# Patient Record
Sex: Female | Born: 1990 | Hispanic: Yes | Marital: Single | Smoking: Never smoker
Health system: Southern US, Community
[De-identification: ages and names within clinical notes are randomized; demographics above are authoritative.]

## PROBLEM LIST (undated history)

## (undated) DIAGNOSIS — L0291 Cutaneous abscess, unspecified: Secondary | ICD-10-CM

## (undated) DIAGNOSIS — Z8489 Family history of other specified conditions: Secondary | ICD-10-CM

## (undated) DIAGNOSIS — Z789 Other specified health status: Secondary | ICD-10-CM

## (undated) HISTORY — DX: Cutaneous abscess, unspecified: L02.91

## (undated) HISTORY — DX: Family history of other specified conditions: Z84.89

## (undated) HISTORY — PX: ABSCESS DRAINAGE: SHX1119

## (undated) HISTORY — DX: Other specified health status: Z78.9

---

## 2020-06-25 ENCOUNTER — Encounter: Payer: Self-pay | Admitting: Family Medicine

## 2020-06-25 ENCOUNTER — Other Ambulatory Visit: Payer: Self-pay

## 2020-06-25 ENCOUNTER — Ambulatory Visit (INDEPENDENT_AMBULATORY_CARE_PROVIDER_SITE_OTHER): Payer: Medicaid Other | Admitting: Obstetrics & Gynecology

## 2020-06-25 VITALS — BP 105/66 | HR 73

## 2020-06-25 DIAGNOSIS — Z3201 Encounter for pregnancy test, result positive: Secondary | ICD-10-CM | POA: Diagnosis not present

## 2020-06-25 DIAGNOSIS — Z3A11 11 weeks gestation of pregnancy: Secondary | ICD-10-CM

## 2020-06-25 LAB — POCT PREGNANCY, URINE: Preg Test, Ur: POSITIVE — AB

## 2020-06-25 NOTE — Progress Notes (Signed)
Video Interpreter # [id number] Pt here today for pregnancy test.  Resulting positive.  Denies vaginal bleeding or pain.  Pt reports that LMP 04/08/20, 11w 1d today, and EDD 01/14/20.  Pt provided with list of medications that is safe to take in pregnancy.  Pt encouraged to begin PNV.  Pt advised that the front office will schedule her appointment accordingly.    Addison Naegeli, RN  06/25/20

## 2020-06-28 NOTE — Progress Notes (Signed)
Pt seen for new OB visit. All questions answered. Prenatal vitamins given.

## 2020-07-06 ENCOUNTER — Ambulatory Visit (INDEPENDENT_AMBULATORY_CARE_PROVIDER_SITE_OTHER): Payer: Self-pay | Admitting: *Deleted

## 2020-07-06 ENCOUNTER — Other Ambulatory Visit: Payer: Self-pay

## 2020-07-06 ENCOUNTER — Encounter: Payer: Self-pay | Admitting: *Deleted

## 2020-07-06 DIAGNOSIS — Z789 Other specified health status: Secondary | ICD-10-CM

## 2020-07-06 DIAGNOSIS — Z3490 Encounter for supervision of normal pregnancy, unspecified, unspecified trimester: Secondary | ICD-10-CM | POA: Insufficient documentation

## 2020-07-06 LAB — POCT URINALYSIS DIP (DEVICE)
Bilirubin Urine: NEGATIVE
Glucose, UA: NEGATIVE mg/dL
Hgb urine dipstick: NEGATIVE
Ketones, ur: NEGATIVE mg/dL
Leukocytes,Ua: NEGATIVE
Nitrite: NEGATIVE
Protein, ur: NEGATIVE mg/dL
Specific Gravity, Urine: >=1.03 (ref 1.005–1.030)
Urobilinogen, UA: 0.2 mg/dL (ref 0.0–1.0)
pH: 7 (ref 5.0–8.0)

## 2020-07-06 NOTE — Progress Notes (Signed)
New OB Intake Molly Pratt in office today for visit.   I explained I am completing New OB Intake today. We discussed her EDD of 01/13/2021 that is based on LMP of 04/08/2020. Pt is G3/P2002. I reviewed her allergies, medications, Medical/Surgical/OB history, and appropriate screenings. I informed her of [facility] services. Based on history, this is a/an uncomplicated pregnancy.  Concerns addressed today   Blood Pressure Cuff Not ordered- will have all in person visits due to Spanish speaking.  Anatomy US Explained first scheduled [place] will be around 19 weeks. Anatomy US scheduled for 07/13/2020 at 0745. Pt notified to arrive at 0730.  Labs Discussed Molly Pratt genetic screening with patient. Would like both Panorama and Horizon drawn today and  Routine prenatal labs drawn.  WIC Patient has appointment for tomorrow she had already scheduled.   COVID Vaccine Patient has already had 2 doses and provided her card. I abstracted information into her Immunization record.  First visit review I reviewed new OB appt with pt. I explained she will have a pelvic exam,PAP smear, gc. Explained pt will be seen by Molly Pratt, CNM at first visit; encounter routed to appropriate provider.  Molly Canepa,RN 07/06/2020  8:40 AM

## 2020-07-06 NOTE — Progress Notes (Signed)
Here for intitial prenatal visit. Given new ob packets.

## 2020-07-06 NOTE — Patient Instructions (Signed)
  At [facility], we work as an integrated team, providing care to address both physical and emotional health. Your medical provider may refer you to see our Behavioral Health Clinician (BHC) on the same day you see your medical provider, as availability permits.  Our BHC is available to all patients, visits generally last between 20-30 minutes, but can be longer or shorter, depending on patient need. The BHC offers help with stress management, coping with symptoms of depression and anxiety, major life changes , sleep issues, changing risky behavior, grief and loss, life stress, working on personal life goals, and  behavioral health issues, as these all affect your overall health and wellness.  The BHC is NOT available for the following: FMLA paperwork, court-ordered evaluations, specialty assessments (custody or disability), letters to employers, or obtaining certification for an emotional support animal. The BHC does not provide long-term therapy. You have the right to refuse integrated behavioral health services, or to reschedule to see the BHC at a later date.  Exception: If you are having thoughts of suicide, we require that you either see the BHC for further assessment, or contract for safety with your medical provider. Confidentiality exception: If it is suspected that a child or disabled adult is being abused or neglected, we are required by law to report that to either Child Protective Services or Adult Protective Services.  If you have a diagnosis of Bipolar affective disorder, Schizophrenia, or recurrent Major depressive disorder, we will recommend that you establish care with a psychiatrist, as these are lifelong, chronic conditions, and we want your overall emotional health and medications to be more closely monitored. If you anticipate needing extended maternity leave due to mental illness, it it recommended you inform your medical provider, so  we can put in a referral to a  psychiatrist as soon as possible. The BHC is unable to recommend an extended maternity leave for mental health issues. Your medical provider or BHC may refer you to a therapist for ongoing, traditional therapy, or to a psychiatrist, for medication management, if it would benefit your overall health. Depending on your insurance, you may have a copay to see the BHC. If you are uninsured, it is recommended that you apply for financial assistance. (Forms may be requested at the front desk for in-person visits, via MyChart, or request a form during a virtual visit).  If you see the BHC more than 6 times, you will have to complete a comprehensive clinical assessment interview with the BHC to resume integrated services.  For virtual visits with the BHC, you must be physically in the state of [place] at the time of the visit. For example, if you live in [place], you will have to do an in-person visit with the BHC. If you are going out of the state or country for any reason, the BHC may see you virtually when you return to [place], but not while you are physically outside of [place].    

## 2020-07-07 LAB — CBC/D/PLT+RPR+RH+ABO+RUB AB...
Antibody Screen: NEGATIVE
Basophils Absolute: 0 10*3/uL (ref 0.0–0.2)
Basos: 1 %
EOS (ABSOLUTE): 0.1 10*3/uL (ref 0.0–0.4)
Eos: 1 %
HCV Ab: <0.1 s/co ratio (ref 0.0–0.9)
HIV Screen 4th Generation wRfx: NONREACTIVE
Hematocrit: 41.3 % (ref 34.0–46.6)
Hemoglobin: 14 g/dL (ref 11.1–15.9)
Hepatitis B Surface Ag: NEGATIVE
Immature Grans (Abs): 0 10*3/uL (ref 0.0–0.1)
Immature Granulocytes: 0 %
Lymphocytes Absolute: 1.4 10*3/uL (ref 0.7–3.1)
Lymphs: 22 %
MCH: 29.3 pg (ref 26.6–33.0)
MCHC: 33.9 g/dL (ref 31.5–35.7)
MCV: 86 fL (ref 79–97)
Monocytes Absolute: 0.4 10*3/uL (ref 0.1–0.9)
Monocytes: 6 %
Neutrophils Absolute: 4.5 10*3/uL (ref 1.4–7.0)
Neutrophils: 70 %
Platelets: 188 10*3/uL (ref 150–450)
RBC: 4.78 x10E6/uL (ref 3.77–5.28)
RDW: 12.9 % (ref 11.7–15.4)
RPR Ser Ql: NONREACTIVE
Rh Factor: POSITIVE
Rubella Antibodies, IGG: 7.54 index (ref >0.99)
WBC: 6.4 10*3/uL (ref 3.4–10.8)

## 2020-07-07 LAB — HEMOGLOBIN A1C
Est. average glucose Bld gHb Est-mCnc: 100 mg/dL
Hgb A1c MFr Bld: 5.1 % (ref 4.8–5.6)

## 2020-07-07 LAB — HCV INTERPRETATION

## 2020-07-08 LAB — CULTURE, OB URINE

## 2020-07-08 LAB — URINE CULTURE, OB REFLEX

## 2020-07-13 ENCOUNTER — Other Ambulatory Visit (HOSPITAL_COMMUNITY)
Admission: RE | Admit: 2020-07-13 | Discharge: 2020-07-13 | Disposition: A | Discharge status: Home / Self Care | Payer: Medicaid Other | Source: Ambulatory Visit | Attending: Certified Nurse Midwife | Admitting: Certified Nurse Midwife

## 2020-07-13 ENCOUNTER — Encounter: Payer: Self-pay | Admitting: Certified Nurse Midwife

## 2020-07-13 ENCOUNTER — Ambulatory Visit (INDEPENDENT_AMBULATORY_CARE_PROVIDER_SITE_OTHER): Payer: Medicaid Other | Admitting: Certified Nurse Midwife

## 2020-07-13 ENCOUNTER — Other Ambulatory Visit: Payer: Self-pay

## 2020-07-13 VITALS — BP 105/75 | HR 89 | Wt 125.4 lb

## 2020-07-13 DIAGNOSIS — Z3492 Encounter for supervision of normal pregnancy, unspecified, second trimester: Secondary | ICD-10-CM

## 2020-07-13 DIAGNOSIS — O26892 Other specified pregnancy related conditions, second trimester: Secondary | ICD-10-CM | POA: Insufficient documentation

## 2020-07-13 DIAGNOSIS — Z789 Other specified health status: Secondary | ICD-10-CM

## 2020-07-13 DIAGNOSIS — Z3A13 13 weeks gestation of pregnancy: Secondary | ICD-10-CM

## 2020-07-13 DIAGNOSIS — N898 Other specified noninflammatory disorders of vagina: Secondary | ICD-10-CM

## 2020-07-13 NOTE — Progress Notes (Signed)
History:   Molly Pratt is a 29 y.o. G3P2002 at [redacted]w[redacted]d by LMP being seen today for her first obstetrical visit.  Her obstetrical history is significant for 2 SVD. Patient does intend to breast feed. Pregnancy history fully reviewed.  Patient reports urinary frequency .     HISTORY: OB History  Gravida Para Term Preterm AB Living  3 2 2  0 0 2  SAB TAB Ectopic Multiple Live Births  0 0 0 0 2    # Outcome Date GA Lbr Len/2nd Weight Sex Delivery Anes PTL Lv  3 Current           2 Term 11/13/08 [redacted]w[redacted]d  7 lb 8 oz (3.402 kg)  Vag-Spont None  LIV     Birth Comments: wnl  1 Term 09/05/05 [redacted]w[redacted]d  7 lb (3.175 kg)  Vag-Spont None  LIV     Birth Comments: wnl    Last pap smear was done 2019 in 2020 and was normal per patient- we do not have records.   Past Medical History:  Diagnosis Date  . Abscess    in back as child,had surgery   Past Surgical History:  Procedure Laterality Date  . ABSCESS DRAINAGE     back as a child   Family History  Problem Relation Age of Onset  . Hypertension Mother    Social History   Tobacco Use  . Smoking status: Never Smoker  . Smokeless tobacco: Never Used  Vaping Use  . Vaping Use: Never used  Substance Use Topics  . Alcohol use: Not Currently    Comment: occasionally   . Drug use: Never   No Known Allergies Current Outpatient Medications on File Prior to Visit  Medication Sig Dispense Refill  . Prenatal Vit-Fe Fumarate-FA (PRENATAL VITAMINS PO) Take 1 tablet by mouth daily.     No current facility-administered medications on file prior to visit.    Review of Systems Pertinent items noted in HPI and remainder of comprehensive ROS otherwise negative. Physical Exam:   Vitals:   07/13/20 0910  BP: 105/75  Pulse: 89  Weight: 125 lb 6.4 oz (56.9 kg)   Fetal Heart Rate (bpm): 156  Pelvic Exam: Perineum: no hemorrhoids, normal perineum   Vulva: normal external genitalia, no lesions  System: General: well-developed,  well-nourished female in no acute distress   Breasts:  normal appearance, no masses or tenderness bilaterally   Skin: normal coloration and turgor, no rashes   Neurologic: oriented, normal, negative, normal mood   Extremities: normal strength, tone, and muscle mass, ROM of all joints is normal   HEENT PERRLA, extraocular movement intact and sclera clear   Mouth/Teeth mucous membranes moist, pharynx normal without lesions and dental hygiene good   Neck supple and no masses   Cardiovascular: regular rate and rhythm   Respiratory:  no respiratory distress, normal breath sounds   Abdomen: soft, non-tender; bowel sounds normal; no masses,  no organomegaly    Assessment:    Pregnancy: 07/15/20 Patient Active Problem List   Diagnosis Date Noted  . Supervision of low-risk pregnancy 07/06/2020  . Language barrier 07/06/2020     Plan:    1. Encounter for supervision of low-risk pregnancy in second trimester - patient doing well, reports urinary frequency since becoming pregnant, discussed results of urine culture with patient which was negative, urinary frequency present due to pregnancy  - routine prenatal care - anticipatory guidance on upcoming appointments - patient request pap smear to be done PP  2. Language barrier - spanish interpreter at bedside   3. Vaginal discharge during pregnancy in second trimester - patient reports vaginal discharge for the past 2 months  - describes discharge as white secretions with odor  - blind swabs collected today and will manage accordingly  - Cervicovaginal ancillary only( [place])  4. [redacted] weeks gestation of pregnancy   Initial labs reviewed with patient  Continue prenatal vitamins. Problem list reviewed and updated. Genetic Screening discussed, NIPS: results pending. Ultrasound discussed; fetal anatomic survey: ordered. Anticipatory guidance about prenatal visits given including labs, ultrasounds, and testing. Discussed usage of  Babyscripts and virtual visits as additional source of managing and completing prenatal visits in midst of coronavirus and pandemic.   Encouraged to complete MyChart Registration for her ability to review results, send requests, and have questions addressed.  The nature of [facility] with multiple MDs and Advanced Practice Providers was explained to patient; also emphasized that residents, students are part of our team. Routine obstetric precautions reviewed. Encouraged to seek out care at office or emergency room [facility] preferred) for urgent and/or emergent concerns. Return in about 4 weeks (around 08/10/2020) for LROB, in person, AFP.     Sharyon Cable, CNM [facility] [employer]

## 2020-07-13 NOTE — Patient Instructions (Signed)
AREA PEDIATRIC/FAMILY PRACTICE PHYSICIANS  [place] ([address]) . [facility] o Chambliss, MD; Eniola, MD; Hale, MD; Hensel, MD; McDiarmid, MD; McIntyer, MD; Leshawn Straka, MD; Walden, MD o [address] [place], [place] [address] o [phone] o Mon-Fri 8:30-12:30, 1:30-5:00 o Providers come to see babies at [facility] o Accepting Medicaid . [facility] o Limited providers who accept newborns: Koirala, MD; Morrow, MD; Wolters, MD o [address], [place], [place] [address] o [phone] o Mon-Fri 8:00-5:30 o Babies seen by providers at [facility] o Does NOT accept Medicaid o Please call early in hospitalization for appointment (limited availability)  . [facility] o Mulberry, MD o [address] [place], [place] [address] o [phone] o Mon, Tue, Thur, Fri 8:30-5:00, Wed 10:00-7:00 (closed 1-2pm) o Babies seen by [facility] providers o Accepting Medicaid . [facility] o Rubin, MD o [address], [place], [place] [address] o [phone] o Mon-Fri 8:30-5:00, Sat 8:30-12:00 o Provider comes to see babies at [facility] o Accepting Medicaid o Must have been referred from current patients or contacted office prior to delivery . [facility] ([facility]) o Brown, MD; Chandler, MD; Ettefagh, MD; Grant, MD; Lester, MD; McCormick, MD; McQueen, MD; Prose, MD; Simha, MD; Stanley, MD; Stryffeler, NP; Tebben, NP o [address], [place], [place] [address] o [phone] o Mon, Tue, Thur, Fri 8:30-5:30, Wed 9:30-5:30, Sat 8:30-12:30 o Babies seen by [facility] providers o Accepting Medicaid o Only accepting infants of first-time parents or siblings of current patients o Hospital discharge coordinator will make follow-up appointment . Jack Amos o [address],  [place], [place]  [address] o [phone]   Fax - [phone] . [facility] o [address], [place], [place]  [address] o Phone - [phone]   Fax - [phone] . Shilpa Gosrani o [address], [place], [place]  [address] o [phone]  [place] ([address]) . [facility] o Bates, MD; [place], MD; Cooper, Cox, MD; MD; Davis, MD; Dovico, MD; Ettefaugh, MD; Little, MD; Lowe, MD; Keiffer, MD; Melvin, MD; Sumner, MD; Williams, MD o [address], [place], [place] [address] o [phone] o Mon-Fri 8:30-5:00 (extended evenings Mon-Thur as needed), Sat-Sun 10:00-1:00 o Providers come to see babies at [facility] o Accepting Medicaid for families of first-time babies and families with all children in the household age 3 and under. Must register with office prior to making appointment (M-F only). . [facility] o Henson, NP; Knapp, MD; Lalonde, MD; Tysinger, PA o [address] [place], [place] [address] o [phone] o Mon-Fri 8:00-5:00 o Babies seen by providers at [facility] o Does NOT accept Medicaid/Commercial Insurance Only . [facility] [place] ([facility])  o Artis, MD; Barnes, MD; Bratton, MD; Coccaro, MD; Lockett Gardner, MD; Kramer, MD; Marshall, MD; Netherton, MD; Poleto, MD; Skinner, MD o [address] [place], [place] [address] o [phone] o Mon-Fri 8:30-5:30, Sat (Oct.-Mar.) 9:00-1:00 o Babies seen by providers at [facility] o Accepting Medicaid  [place] ([address]) . [facility] o Reid, MD; Warner, MD o [address], [place], [place] [address] o [phone] o Mon-Fri 8:30-5:00, Sat 8:30-12:00 o Providers come to see babies at [facility] o Does NOT accept Medicaid . [facility] o Becker, PA; Hagler, MD; Scifres, PA; Sun, MD; Swayne, MD o [address],  [place], [place] [address] o [phone] o Mon-Fri 8:00-5:00 o Babies seen by providers at [facility] o Does NOT accept Medicaid o Only accepting babies of parents who are patients o Please call early in hospitalization for appointment (limited availability) . [facility] o Clark, MD; Frye, MD; Kelleher, MD; Mack, NP; Miller, MD; O'Keller, MD; Patterson, NP; Pudlo, MD; Puzio, MD; Thomas, MD; Tucker, MD; Twiselton, MD o [address], [place], [place] [address] o [phone] o Mon-Fri 8:00-5:00, Sat 9:00-12:00 o Providers come to see babies at [facility] o Does NOT accept Medicaid  [place] ([address]) . [facility] o Limited providers accepting new patients: Brake, NP; Wharton, PA o [address], [place], [place] [address] o [phone] o Mon-Fri 8:00-5:00 o Babies seen by providers at [facility] o Does NOT accept Medicaid o Only accepting babies of parents who are patients o Please call early in hospitalization for appointment (limited availability) . [facility] o Gay, MD; Quinlan, MD o [address] [place], [place] [address] o [phone] (press 1 to schedule appointment) o Mon-Fri 8:00-5:00 o Providers come to see babies at [facility] o Does NOT accept Medicaid . [facility] o Mazer, MD o [address] [place], [place] [address] o [phone] o Mon-Fri 8:30-5:00 (lunch 12:30-1:00), extended hours by appointment only Wed 5:00-6:30 o Babies seen by [facility] providers o Accepting Medicaid . [facility] o Banks, MD; Jordan, MD; Koberlein, MD o [address], [place], [place] [address] o [phone] o Mon-Fri 8:00-5:00 o Babies seen by [facility] providers o Does NOT accept Medicaid . [facility] o Parker, MD; Hunter, MD; Wallace, DO o [address] [place], [place]  [address] o [phone] o Mon-Fri 8:00-5:00 o Babies seen by [facility] providers o Does NOT accept Medicaid . [facility] o Brandon, PA; Brecken, PA; Christy, NP; Dees, MD; DeClaire, MD; DeWeese, MD; Hansen, NP; Mills, NP; Parrish, NP; Smoot, NP; Summer, MD; Vapne, MD o [address] [place], [place] [address] o [phone] o Mon-Fri 8:30-5:00, Sat 10:00-1:00 o Providers come to see babies at [facility] o Does NOT accept Medicaid o Free prenatal information session Tuesdays at 4:45pm . [facility] o Bouska, MD; Gordon, PA; Jeffery, PA; Weber, PA o [address] [place] [address] o [phone] o Mon-Fri 7:30-5:30 o Babies seen by [facility] providers . [facility] o [address], [place], [place]  [address] o [phone]   Fax - [phone]  [place] ([address]) . [facility] o Reese, MD o [address] [place], [place] [address] o [phone] o Mon-Thur 8:00-6:00 o Providers come to see babies at [facility] o Accepting Medicaid . [facility] o Anderson, NP; Badger, MD; Beal, PA; Spencer, PA o [address] [place], [place] [address] o [phone] o Mon-Thur 7:30-7:30, Fri 7:30-4:30 o Babies seen by [facility] providers o Accepting Medicaid . [facility] o Agbuya, MD; Klett, NP; Romgoolam, MD o [address], [place],  [address] o [phone] o Mon-Fri 8:30-5:00, Sat 8:30-12:00 o Providers come to see babies at [facility] o Accepting Medicaid o Must have "Meet & Greet" appointment at office prior to delivery . [facility] ([facility] o McCord,   MD; Wallace, MD; Wood, MD o [address], [place], [place] [address] o [phone] o Mon-Wed 8:00-6:00, Thur-Fri 8:00-5:00, Sat 9:00-12:00 o Providers come to  see babies at [facility] o Does NOT accept Medicaid o Only accepting siblings of current patients . [facility]  o [address], [place], [place]  [address] o [phone]   Fax - [phone] . [facility] o [address], [place], [place]  [address] o [phone]   Fax - [phone]  [place] ([address] & [address]) . [facility] o Cirigliano, DO; Matthews, DO o [address] [place], [place] [address] o [phone] o Mon-Fri 7:00-5:00 o Babies seen by [facility] providers o Does NOT accept Medicaid . [facility] o Briscoe, MD; Howley, PA; Moreira, PA o [address], [place], [place] [address] o [phone] o Mon-Fri 8:00-5:00 o Babies seen by [facility] providers o Accepting Medicaid . [facility] o Boyd, MD; Church, PA; Jones, NP; Osborn, PA o [address], [place], [place] [address] o [phone] o Mon-Fri 8:00-5:00 o Babies seen by providers at [facility] o Accepting Medicaid  [place] ([address]) . [facility] [place] o Wendling, DO o [address] [place], [place] [address] o [phone] o Mon-Fri 8:00-5:00 o Babies seen by [facility] providers o Does NOT accept Medicaid o Limited availability, please call early in hospitalization to schedule follow-up . [facility] o Calderon, PA; Cummings, MD; Dillard, MD; Martin, PA; Olson, MD; VanDeven, PA o [address], [place], [place] [address] o [phone] o Mon-Fri 8:30-5:00, Sat 9:00-12:00 o Babies seen by providers at [facility] o Accepting Medicaid o Please register online then schedule online or call office o [email or web address] . [facility] o Hunter, NP; Kumar, MD; Martin Rogers, PA o [address], [place], [place] [address] o [phone] o Mon-Fri 8:00-5:00 o Babies seen by providers at [facility] o Accepting Medicaid . [facility] ([facility] o Coldstream, MD; Kristi Fleenor, NP; West, MD o [address], [place], [place] [address] o [phone] o Mon-Fri 8:00-5:30, Sat&Sun by appointment (phones open at 8:30) o Babies seen by [facility] providers o Accepting Medicaid o Must be a first-time baby or sibling of current patient . [facility] [place]  o [address], [place], [place]  [address] o [phone]   Fax - [phone]  [place] ([address]) . [facility] o Brown, PA; Cowen, PA; Rice, MD; Helton, PA; Spry, MD o [address] [place], [place] [address] o [phone] o Mon-Thur 8:00-7:00, Fri 8:00-5:00, Sat 8:00-12:00, Sun 9:00-12:00 o Babies seen by [facility] providers o Accepting Medicaid . [facility] [place] o Coe-Goins, MD; Marshall, MD; Pierre-Louis, MD o [address], [place], [place] [address] o [phone] o Mon-Thur 8:00-5:00 o Babies seen by providers at [facility] o Accepting Medicaid . [facility] o Bratton, MD; Coe-Goins, MD; Hayes, MD; Lewis, MD; List, MD; Lott, MD; Marshall, MD; Moran, MD; O'Daxson Reffett, MD; Pierre-Louis, MD; Pitonzo, MD; Scholer, MD; Spangle, MD o [address] [place],    [address] o [phone] o Mon-Fri 8:00-5:30, Sat (Oct.-Mar.) 9:00-1:00 o Babies seen by providers at [facility] o Accepting Medicaid o Must fill out new patient packet, available online at [email or web address] . [facility] ([facility] o Friddle, NP; Harris, NP; Kelly, NP; Logan, MD;  Melvin, PA; Poth, MD; Ramadoss, MD; Stanton, NP o [address], [place], [place] [address] o [phone] o Mon-Thur 8:00-5:30, Fri 8:00-5:00 o Babies seen by providers at [facility] o Accepting Medicaid  [place] ([address]) . [facility] o Dixon, PA; Lemay, MD; Pickard, MD; Tapia, PA o [address], [place], [place] [address] o [phone] o Mon-Fri 8:00-5:00 o Babies seen by providers at [facility] o Accepting Medicaid   [place] ([address]) . [facility] [place] o Masneri, DO; Meyers, MD; Nelson, PA o [address], [place], [place] [address] o [phone] o Mon-Fri 8:00-5:00 o Babies seen by providers at [facility] o Does NOT accept Medicaid o Limited appointment availability, please call early in hospitalization  . [facility] [place] o Kunedd, DO; McGowen, MD o [address], [place], [place] [address] o [phone] o Mon-Fri 8:00-5:00 o Babies seen by [facility] providers o Does NOT accept Medicaid . [facility] [place] o Cameron, MD; MacDonald, MD; Michaels, PA; Nayak, MD o [address] [place] [address], [place], [place] [address] o [phone] o Mon-Fri 8:00-5:00 o After hours clinic ([address] [place], [place] [address]) [phone] Mon-Fri 5:00-8:00, Sat 12:00-6:00, Sun 10:00-4:00 o Babies seen by [facility] providers o Accepting Medicaid . [facility] [place] o [address], [place], [place]  [address] o [phone]   Fax - [phone]  [place] ([address]) . [facility] [place] [facility] o Andy, MD o [address], [place], [place] [address] o [phone] o Mon-Fri 8:00-5:00 o Babies seen by [facility] providers o Does NOT accept Medicaid . [facility] [place] ([facility] [place]) o Eksir, MD o [address], [place], [place]  [address] o [phone] o Mon-Thur 8:00-7:00, Fri 8:00-5:00, Sat 8:00-12:00 o Babies seen by providers at [facility] o Accepting Medicaid - but does not have vaccinations in office (must be received elsewhere) o Limited availability, please call early in hospitalization  [place] ([address]) . [facility]  o Charlene Flemming, MD o [address] [place] [address], [place] [address] o [phone]  Fax [phone]   

## 2020-07-14 ENCOUNTER — Telehealth: Payer: Self-pay | Admitting: Lactation Services

## 2020-07-14 NOTE — Telephone Encounter (Signed)
Called patient with assistance of [address] Spanish Telephone Interpreter, [place] # [phone].   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   Reviewed it is recommended that she call [facility] at [phone] to schedule a Telephone Genetic Counseling Session to discuss results. Reviewed it is recommended that fob also be tested to see if he has the same gene.   Patient questions answered. Patient voiced understanding.

## 2020-07-15 ENCOUNTER — Encounter: Payer: Self-pay | Admitting: *Deleted

## 2020-07-15 ENCOUNTER — Encounter: Payer: Self-pay | Admitting: Certified Nurse Midwife

## 2020-07-15 DIAGNOSIS — Z148 Genetic carrier of other disease: Secondary | ICD-10-CM | POA: Insufficient documentation

## 2020-07-16 LAB — CERVICOVAGINAL ANCILLARY ONLY
Bacterial Vaginitis (gardnerella): POSITIVE — AB
Candida Glabrata: NEGATIVE
Candida Vaginitis: NEGATIVE
Chlamydia: NEGATIVE
Comment: NEGATIVE
Comment: NEGATIVE
Comment: NEGATIVE
Comment: NEGATIVE
Comment: NEGATIVE
Comment: NORMAL
Neisseria Gonorrhea: NEGATIVE
Trichomonas: NEGATIVE

## 2020-07-17 ENCOUNTER — Telehealth: Payer: Self-pay

## 2020-07-17 MED ORDER — METRONIDAZOLE 500 MG PO TABS: 500.0000 mg | ORAL_TABLET | Freq: Two times a day (BID) | ORAL | 0 refills | Status: DC

## 2020-07-17 NOTE — Telephone Encounter (Signed)
Called Pt using [address] [place] id# [phone], to advise Pt os test results showing +BV, No answer, left VM for call back.

## 2020-07-17 NOTE — Telephone Encounter (Signed)
-----   Message from Sharyon Cable, CNM sent at 07/17/2020  9:22 AM EST ----- Please call patient and notify of bacterial vaginosis. Rx sent to pharmacy on file for treatment.    Steward Drone CNM

## 2020-07-17 NOTE — Addendum Note (Signed)
Addended by: Sharyon Cable on: 07/17/2020 09:22 AM   Modules accepted: Orders

## 2020-07-27 ENCOUNTER — Telehealth: Payer: Self-pay

## 2020-07-27 NOTE — Telephone Encounter (Signed)
Called Pt using [address] Molly Pratt id# [id number] to advise that she tested positive for BV & Rx was sent to her pharmacy. Pt stated pharmacy did call her but she couldn't understand them. So explained to her the name of the medicine & how to take it. Pt verbalized understanding.

## 2020-08-12 ENCOUNTER — Encounter: Payer: Self-pay | Admitting: Student

## 2020-08-12 ENCOUNTER — Encounter: Payer: Self-pay | Admitting: *Deleted

## 2020-08-12 ENCOUNTER — Other Ambulatory Visit: Payer: Self-pay

## 2020-08-12 ENCOUNTER — Ambulatory Visit (INDEPENDENT_AMBULATORY_CARE_PROVIDER_SITE_OTHER): Payer: Medicaid Other | Admitting: Student

## 2020-08-12 VITALS — BP 109/78 | HR 86 | Wt 126.5 lb

## 2020-08-12 DIAGNOSIS — Z3492 Encounter for supervision of normal pregnancy, unspecified, second trimester: Secondary | ICD-10-CM

## 2020-08-12 DIAGNOSIS — Z3A18 18 weeks gestation of pregnancy: Secondary | ICD-10-CM | POA: Diagnosis not present

## 2020-08-12 NOTE — Patient Instructions (Signed)
El sndrome frgil de X (FXS) es una condicin gentica que muestra atributos fsicos tpicos junto con anomalas del comportamiento y de desarrollo en nios. Tambin se llama sndrome de Alejandro Mulling o un sndrome del marcador X.  FXS presenta con discurso y el desarrollo del lenguaje demorados junto con un cierto nivel de incapacidad intelectual. stos pueden tambin incluir ADHD, ansiedad y autstico-como incapacidad social. Aunque los atributos fsicos sean algo sutiles en nios jovenes, llegan a ser ms prominentes con edad. stos incluyen los aros hundidos (hipoplasia del midface) con Neomia Dear cara larga, dedos pulgares juntados dobles, un paladar arqueado, los pies planos y los odos grandes. Las manifestaciones fsicas de FXS no estn siempre presentes en su totalidad.  Sndrome frgil de X, un desorden gentico del cromosoma X (gen FMR1) y sntomas principales y Administrator, Civil Service. Leanor Rubenstein de imagen: ellepigrafica/Shutterstock Sndrome frgil de X, un desorden gentico del cromosoma X (gen FMR1) y sntomas principales y Administrator, Civil Service. Leanor Rubenstein de imagen: ellepigrafica/[place]utter[employer] Gentica y patofisiologa de FXS FXS es la causa ms comn de la discapacidad de Psychologist, prison and probation services. FXS es causas por las mutaciones (averas) a un gen en el cromosoma X (FMRI1). En esta mutacin, los pacientes pueden tener extensiones hasta 200 o relanzada de CGG, mientras que la poblacin normal tendra no ms que 40 repeticiones. Esta extensin de la repeticin impone silencio al gen que lo hace functionless. Por consiguiente, se empeora el revelado normal del cerebro se afecta es decir la capacidad para las conexiones de los nervios con plasticidad sinptica.  FXS es ms comn en muchachos que est en muchachas, puesto que los muchachos llevan un nico cromosoma X, l es ms seriamente afectado que las muchachas que pueden tener un gen normal FMR1 en el segundo cromosoma X sano. Las [employer] afectadas  pueden tener solamente incapacidades suaves comparadas a los [place]. Las [employer] sin embargo, son ondas portadoras de la condicin y pueden pasarla conectado a su descendiente masculino.  Algunas personas afectadas pueden tambin tener un pequeo cambio o una mutacin y as pueden ser ondas portadoras. Pasan conectado la mutacin completa a la generacin siguiente.    Representacin diagramtica de la configuracin de la transmisin de X frgil  Cmo el campo comn es FXS? Influencias frgiles de X cerca de 1 en 4000 hombres y alrededor 1 en 6000-8000 mujeres. Afecta a todas las carreras y pertenencias tnicas. Puede ser llevado por sos con la afliccin suave del gen FMR1 y puede ser pasado conectado al descendiente.  En el [place], la incidencia total de FXS en la poblacin en general es 2,3 por 10.000 o 1 en 4.425. Dos a cuatro veces tantas hembras como varones son ondas portadoras del premuation gentico (50 o encima de repeticiones de CGG). Solamente un tercero de hembras lleva el gen anormal responsable de discapacidades de aprendizaje.  Sntomas de FXS El sntoma ms comn del FXS es debilitacin intelectual. Hay una amplia gama de diferencia en el ndice de inteligencia entre sos afectados. Algunos pacientes pueden tener un ndice de inteligencia normal y no [place] ningn signo de X frgil, mientras que algunos pueden tener dificultades de aprendizaje severas. El [place] de debilitacin es relacionado en el fragmento de las repeticiones de CGG al FMR1.  Adems de dficits intelectuales hay tambin problemas emocionales y del comportamiento. stos pueden incluir caractersticas de:  Desorden de dficit de atencin Hiperactividad Impulsivity Ansiedad Voltajes de entrada alternativos de humor Hay caractersticas faciales tpicas y atributos fsicos de FXS. stos incluyen [employer], odos  grandes, pies planos, una alta frente, testculos grandes, bocas grandes, y  juntas extremadamente flexibles.  Diagnosis de FXS X frgil se puede diagnosticar usando anlisis de la DNA para confirmar mutaciones a FMR1. La DNA se puede tambin llegar un feto nonato tambin. Esto es hecha recogiendo [place] del lquido amnitico o de los tejidos dentro de la matriz, Warden/ranger muestreo de vellosidad corinica o amniocentesis, y se Dentist aproximadamente 11 semanas del Psychiatrist.  Administracin y tratamiento de FXS A partir de ahora no hay vulcanizacin o tratamiento para FXS. Los nios con FXS necesitan el apoyo del comportamiento de la terapia, del asesoramiento, emocional y [place]. Los nios con discurso y Media planner del lenguaje demorados pueden necesitar la ayuda del discurso y del terapeuta del lenguaje.  La terapia del comportamiento es til para los nios con dficit de atencin, impulsivity y otros problemas del comportamiento. Algunos nios pueden poder hacer frente bien en escuelas de la corriente principal. Sin embargo, sos con inteligencia y dificultades de aprendizaje importante empeoradas pueden beneficiarse de escuelas especiales para cubrir sus necesidades educativas especiales.  La medicacin tambin se [place] para tratar o para manejar algunos de los sntomas secundarios que presentan variable a travs de [place]. Estas medicaciones incluyen los estimulantes que apuntan los dficits y la hiperactividad attentional (medicaciones de ADHD). SSRIs (antidepresivos) se puede tambin Chemical engineer a veces para tratar voltajes de entrada alternativos de la ansiedad y de humor. Para sos el sufrimiento de los anticonvulsivos de las capturas (el 15% de Civil engineer, contracting) puede tambin ser [place].  La investigacin animal reciente ha [employer] drogas que son los Building control surveyor (receptores del glutamato de la cuadra en el cerebro) pueden Paramedic el aumento sinptico de los problemas por lo tanto cognoscitivo, intelectual y algunos sntomas del  comportamiento. Estas drogas experimentales Continental Airlines prometedores, pero necesidad de ser investigado completo en juicios clnicas ms grandes antes de fijar su eficacia.  Iris Pert

## 2020-08-12 NOTE — Progress Notes (Addendum)
   PRENATAL VISIT NOTE  Subjective:  Molly Pratt is a 29 y.o. G3P2002 at [redacted]w[redacted]d being seen today for ongoing prenatal care.  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  Patient reports no complaints.  Contractions: Not present. Vag. Bleeding: None.  Movement: Absent. Denies leaking of fluid.   The following portions of the patient's history were reviewed and updated as appropriate: allergies, current medications, past family history, past medical history, past social history, past surgical history and problem list.   Objective:   Vitals:   08/12/20 0832  BP: 109/78  Pulse: 86  Weight: 126 lb 8 oz (57.4 kg)    Fetal Status: Fetal Heart Rate (bpm): 151   Movement: Absent     General:  Alert, oriented and cooperative. Patient is in no acute distress.  Skin: Skin is warm and dry. No rash noted.   Cardiovascular: Normal heart rate noted  Respiratory: Normal respiratory effort, no problems with respiration noted  Abdomen: Soft, gravid, appropriate for gestational age.  Pain/Pressure: Absent     Pelvic: Cervical exam deferred        Extremities: Normal range of motion.  Edema: None  Mental Status: Normal mood and affect. Normal behavior. Normal judgment and thought content.   Assessment and Plan:  Pregnancy: G3P2002 at [redacted]w[redacted]d 1. Encounter for supervision of low-risk pregnancy in second trimester -Reassured patient that normal to have loss of appetite and occasional heartburn; patient declines medication.  -Discussed with patient the Fragile X syndrome; information given. Partner would like testing; [facility] contacted and will contact partner for testing - AFP, Serum, Open Spina Bifida  Preterm labor symptoms and general obstetric precautions including but not limited to vaginal bleeding, contractions, leaking of fluid and fetal  movement were reviewed in detail with the patient. Please refer to After Visit Summary for other counseling recommendations.   Return in about 4 weeks (around 09/09/2020), or LROB in person with KK.  Future Appointments  Date Time Provider Department Center  08/19/2020  7:45 AM [facility] US4 [facility]  09/17/2020  8:15 AM Marylene Land, CNM [facility]    Marylene Land, [place]

## 2020-08-14 LAB — AFP, SERUM, OPEN SPINA BIFIDA
AFP MoM: 0.87
AFP Value: 42.7 ng/mL
Gest. Age on Collection Date: 18 weeks
Maternal Age At EDD: 30.3 yr
OSBR Risk 1 IN: 10000
Test Results:: NEGATIVE
Weight: 127 [lb_av]

## 2020-08-19 ENCOUNTER — Ambulatory Visit: Payer: Medicaid Other

## 2020-08-29 NOTE — L&D Delivery Note (Addendum)
Delivery Note Molly Pratt is a 30 y.o. G3P2002 at [redacted]w[redacted]d admitted for IOL for postdates.   GBS Status: Negative/-- (04/20 1109) Maximum Maternal Temperature: 98.31F  Labor course: Initial SVE: 1cm/thick. Augmentation with: Cytotec and IP Foley. She then progressed to complete.  ROM: 4h 18m with meconium stained fluid  Birth: At 0435 a viable female was delivered via spontaneous vaginal delivery (Presentation: LOA). Nuchal cord present: No.  Shoulders and body delivered in usual fashion. Infant placed directly on mom's abdomen for bonding/skin-to-skin, baby dried and stimulated. Cord clamped x 2 after 1 minute and cut by FOB.  Cord blood collected.  The placenta separated spontaneously and delivered via gentle cord traction.  Pitocin infused rapidly IV per protocol.  Fundus firm with massage.  Placenta inspected and appears to be intact with a 3 VC.  Placenta/Cord with the following complications: none .  Cord pH: n/a Sponge and instrument count were correct x2.  Intrapartum complications:  None Anesthesia:  epidural Episiotomy: none Lacerations:  labial Suture Repair: 3.0 vicryl EBL (mL): 200   Infant: APGAR (1 MIN): 9   APGAR (5 MINS): 9   APGAR (10 MINS):    Infant weight: pending  Mom to postpartum.  Baby to Couplet care / Skin to Skin. Placenta to L&D   Plans to Breast and bottlefeed Contraception:  undecided Circumcision: N/A  Note sent to [facility]:  [facility]  for pp visit.  Littie Deeds, MD 01/20/2021 5:15 AM     I have seen and examined this patient and agree with above documentation in the resident's note. I was gloved and present at the time of delivery and available for questions/assistance.  Brand Males, MSN, CNM 01/20/21 5:31 AM

## 2020-09-14 ENCOUNTER — Ambulatory Visit: Payer: Medicaid Other

## 2020-09-17 ENCOUNTER — Ambulatory Visit (INDEPENDENT_AMBULATORY_CARE_PROVIDER_SITE_OTHER): Payer: Medicaid Other | Admitting: Student

## 2020-09-17 ENCOUNTER — Other Ambulatory Visit: Payer: Self-pay

## 2020-09-17 VITALS — BP 103/72 | HR 88 | Wt 136.3 lb

## 2020-09-17 DIAGNOSIS — Z3A23 23 weeks gestation of pregnancy: Secondary | ICD-10-CM

## 2020-09-17 DIAGNOSIS — Z3492 Encounter for supervision of normal pregnancy, unspecified, second trimester: Secondary | ICD-10-CM

## 2020-09-17 NOTE — Patient Instructions (Signed)
Prueba de tolerancia a la glucosa Glucose Tolerance Test Por qu me debo realizar este anlisis? La prueba de tolerancia a la glucosa (PTG) se realiza para Biomedical engineer en que el cuerpo procesa el azcar (glucosa). Esta es una de las diferentes pruebas que se usan para diagnosticar la diabetes (diabetes mellitus). El mdico puede recomendarle esta prueba si usted:  Tiene antecedentes familiares de diabetes.  Tiene obesidad.  Tiene infecciones que se repiten.  Ha tenido muchas heridas que no se curaron rpidamente, en especial en las piernas y los pies.  Es mujer y tiene antecedentes de haber parido bebs muy grandes o antecedentes de prdida fetal repetida (muerte fetal).  Ha tenido niveles altos de glucosa en la orina o en la sangre: ? Durante un embarazo pasado. ? Despus de un infarto de miocardio, [place] o perodos prolongados de [employer]. Qu se analiza? Esta prueba mide la cantidad de glucosa en la sangre en diferentes momentos durante un perodo de 2horas. Esto indica qu tan bien su cuerpo puede procesar la glucosa. Qu tipo de [place] se toma? Para esta prueba, se extraen muestras de sangre. Por lo general, para extraerlas, se introduce una aguja en un vaso sanguneo.   Cmo debo prepararme para esta prueba?  Durante 3 das antes de la prueba, coma normalmente. Coma muchos alimentos con alto contenido de carbohidratos.  Siga las instrucciones del mdico acerca de lo siguiente: ? Restricciones en la comida o la bebida el da de la prueba. Se le podr pedir que no coma ni beba nada ms que agua (ayuno) desde 8 a 12 horas antes de la prueba. ? Cambiar o suspender los medicamentos que [place] habitualmente. Algunos medicamentos pueden interferir en esta prueba. Informe al mdico acerca de lo siguiente:  Todos los [employer] [place], incluidos vitaminas, hierbas, gotas oftlmicas, cremas y [address] de [address].  Cualquier trastorno de la sangre que  tenga.  Cirugas a las que se haya sometido.  Cualquier afeccin mdica que tenga.  Si est embarazada o podra estarlo. Qu ocurre durante la prueba? Primero se le medir la glucemia. Esto se denomina glucemia en ayunas, ya que usted hizo ayuno antes de la prueba. Luego, deber beber Neomia Dear solucin de glucosa que contiene una cantidad especfica de glucosa. Se le medir la glucemia nuevamente 1 y 2 horas despus de beber la solucin. La realizacin de esta prueba lleva 2horas. Durante ese tiempo Teaching laboratory technician donde se realiza la prueba. Durante el perodo de la prueba:  No coma ni beba nada que no sea la solucin de glucosa. Se le permitir beber agua.  No haga ejercicio.  No consuma ningn producto que contenga nicotina o tabaco. Estos productos incluyen cigarrillos, tabaco para Theatre manager y aparatos de vapeo, como los Administrator, Civil Service. Si necesita ayuda para dejar de fumar, consulte al mdico. El procedimiento de prueba puede variar segn el mdico y el hospital. Cmo se informan los [place]? Los [employer] de la prueba se informan [place]. Los resultados se darn como miligramos de glucosa por decilitro de sangre (mg/dl) o milimoles por litro (mmol/l). Su mdico comparar sus resultados con los rangos normales que se establecieron luego de Education officer, environmental la prueba a un grupo grande de personas (rangos de referencia). Los rangos de referencia pueden variar entre laboratorios y hospitales. Los rangos de referencia habituales para esta prueba son los siguientes:  En ayunas: menos de 110mg /dl ( ).  1 hora despus de beber glucosa: menos de 180mg /dl (5,7QION/G).  2 horas  despus de beber glucosa: menos de 140mg /dl ( ). Qu significan los resultados? Los 7,0YFVC/B estn dentro de los rangos de [employer] se consideran normales, lo que significa que sus niveles de glucosa estn bien controlados. Los resultados ms Mining engineer rangos de  referencia pueden significar que usted recientemente sufri estrs, como a causa de una lesin o una afeccin repentina (aguda) como un infarto de miocardio o un accidente cerebrovascular, o que usted tiene:  Diabetes.  Sndrome de [place].  Tumores como feocromocitoma o glucagonoma.  Insuficiencia renal.  Pancreatitis.  Hipertiroidismo.  Una infeccin. Hable con su mdico sobre lo que significan sus [place]. Preguntas para hacerle al mdico Consulte a su mdico o pregunte en el departamento donde se realiza la prueba acerca de lo siguiente:  Cundo estarn disponibles mis resultados?  Cmo obtendr mis resultados?  Cules son las opciones de tratamiento?  Qu otras pruebas necesito?  Cules son los prximos pasos que debo seguir? Resumen  La prueba de tolerancia a la glucosa (PTG) se realiza para [place] en que el cuerpo procesa la glucosa. Esta es una de las diferentes pruebas que se usan para diagnosticar la diabetes.  Esta prueba mide la cantidad de glucosa en la sangre en diferentes momentos durante un perodo de 2horas. Esto indica qu tan bien su cuerpo puede procesar la glucosa.  Hable con su mdico sobre lo que significan sus [place]. Esta informacin no tiene [place] el consejo del mdico. Asegrese de hacerle al mdico cualquier pregunta que tenga. Document Revised: 06/12/2020 Document Reviewed: 06/12/2020 Elsevier Patient Education  2021 2022.

## 2020-09-17 NOTE — Progress Notes (Signed)
Patient states has bveen having shortness of breath off & on.

## 2020-09-17 NOTE — Progress Notes (Signed)
Patient ID: Molly Pratt, female   DOB: 1991/02/16, 30 y.o.   MRN: [id number]   PRENATAL VISIT NOTE  Subjective:  Molly Pratt is a 30 y.o. G3P2002 at [redacted]w[redacted]d being seen today for ongoing prenatal care.  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  Patient reports difficulty with breathing at times, especially after she eats. . She denies fever, muscle aches, chest pain. She reports that she has had this feeling for 15 days, and that her kids have also has this feeling as they have been sick too. She does not think it is COVID and has not been tested. She denies any other symptoms.   Her partner decided not to get tested for Fragile X syndrome.     Contractions: Not present. Vag. Bleeding: None.  Movement: Present. Denies leaking of fluid.   The following portions of the patient's history were reviewed and updated as appropriate: allergies, current medications, past family history, past medical history, past social history, past surgical history and problem list.   Objective:   Vitals:   09/17/20 0822  BP: 103/72  Pulse: 88  Weight: 136 lb 4.8 oz (61.8 kg)    Fetal Status: Fetal Heart Rate (bpm): 155 Fundal Height: 24 cm Movement: Present     General:  Alert, oriented and cooperative. Patient is in no acute distress.  Skin: Skin is warm and dry. No rash noted.   Cardiovascular: Normal heart rate noted  Respiratory: Normal respiratory effort, no problems with respiration noted  Abdomen: Soft, gravid, appropriate for gestational age.  Pain/Pressure: Absent     Pelvic: Cervical exam deferred        Extremities: Normal range of motion.  Edema: None  Mental Status: Normal mood and affect. Normal behavior. Normal judgment and thought content.   Assessment and Plan:  Pregnancy: G3P2002 at [redacted]w[redacted]d  1. [redacted] weeks gestation of  pregnancy   2. Encounter for supervision of low-risk pregnancy in second trimester    2. Discussed physiologic changes in pregnancy related to eating and SOB, reviewed what is considered normal SOB and when to come to MAU; suggested eating small meals,   3. Anticipatory guidance given to patient abotu 2 hour GTT.   Preterm labor symptoms and general obstetric precautions including but not limited to vaginal bleeding, contractions, leaking of fluid and fetal movement were reviewed in detail with the patient. Please refer to After Visit Summary for other counseling recommendations.   Return in about 4 weeks (around 10/15/2020), or 4 weeks for LROB in person and 2 hour GTT.  Future Appointments  Date Time Provider Department Center  09/25/2020  7:45 AM [facility] US4 [facility]  10/15/2020  8:35 AM Marylene Land, CNM [facility]  10/15/2020  9:30 AM [facility]    Marylene Land, CNM

## 2020-09-24 ENCOUNTER — Other Ambulatory Visit: Payer: Self-pay | Admitting: Certified Nurse Midwife

## 2020-09-24 DIAGNOSIS — Z148 Genetic carrier of other disease: Secondary | ICD-10-CM

## 2020-09-25 ENCOUNTER — Other Ambulatory Visit: Payer: Self-pay

## 2020-09-25 ENCOUNTER — Ambulatory Visit: Payer: Medicaid Other | Attending: Obstetrics and Gynecology

## 2020-09-25 ENCOUNTER — Other Ambulatory Visit (HOSPITAL_COMMUNITY): Payer: Self-pay | Admitting: Obstetrics and Gynecology

## 2020-09-25 ENCOUNTER — Other Ambulatory Visit: Payer: Self-pay | Admitting: Certified Nurse Midwife

## 2020-09-25 DIAGNOSIS — Z362 Encounter for other antenatal screening follow-up: Secondary | ICD-10-CM

## 2020-09-25 DIAGNOSIS — Z148 Genetic carrier of other disease: Secondary | ICD-10-CM

## 2020-09-25 DIAGNOSIS — Z3A24 24 weeks gestation of pregnancy: Secondary | ICD-10-CM | POA: Diagnosis not present

## 2020-09-25 IMAGING — US US MFM OB COMP +14 WKS
1 series · 13 of 28 positions shown · non-contrast
Comparison: none

[Series 1: us mfm ob comp +14 wks · 82 acquisitions, 13 frames shown]
[im 4/82]
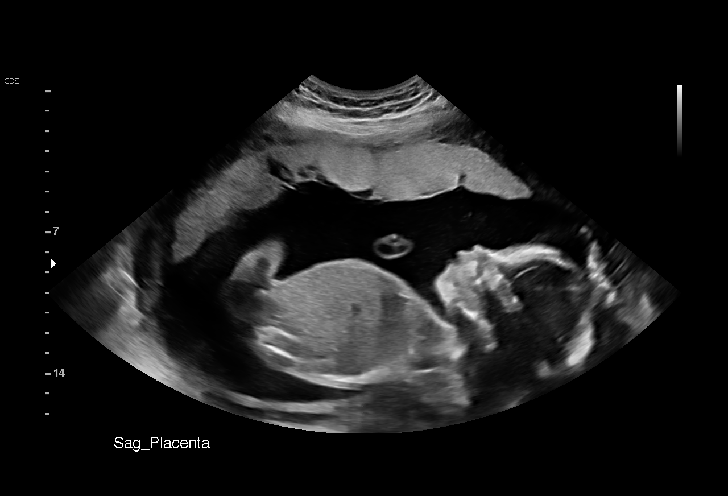
[im 10/82]
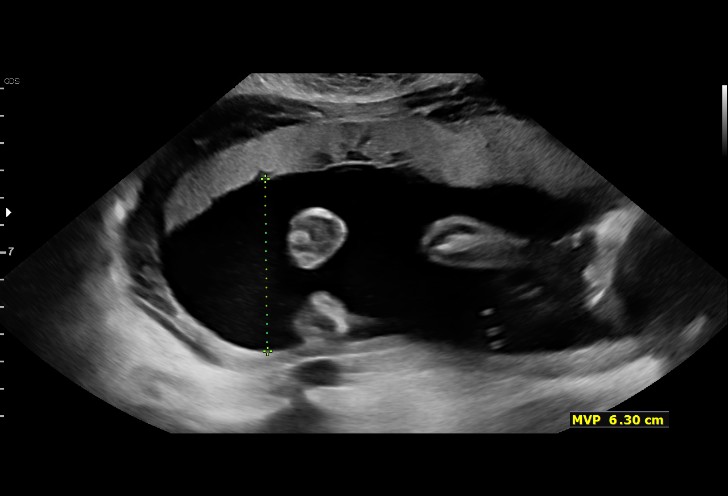
[im 16/82]
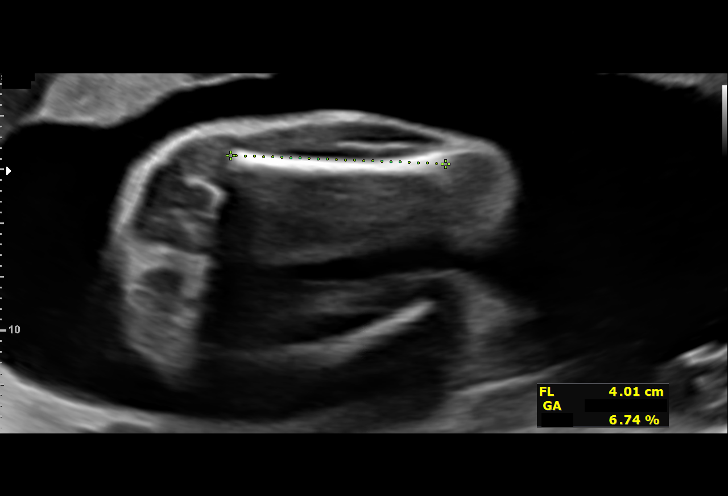
[im 22/82]
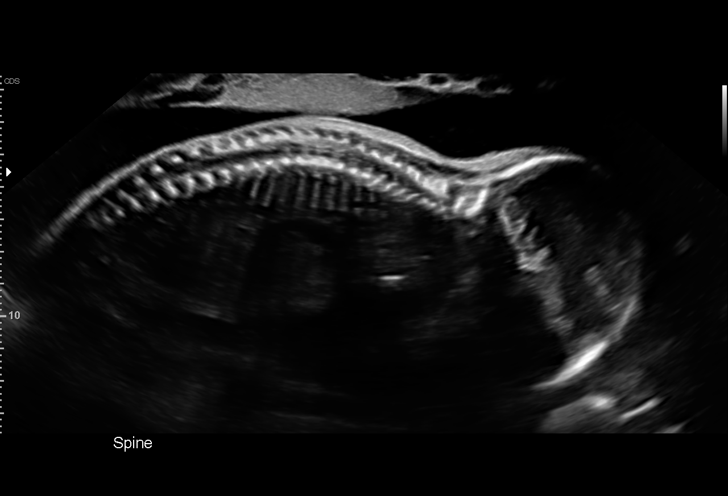
[im 28/82]
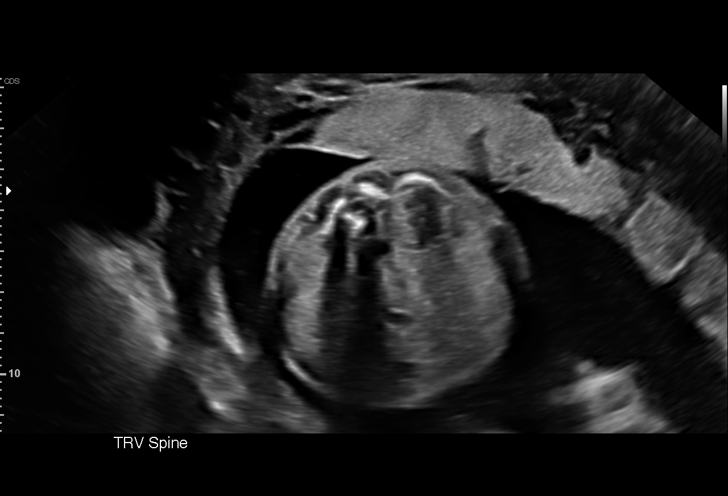
[im 34/82]
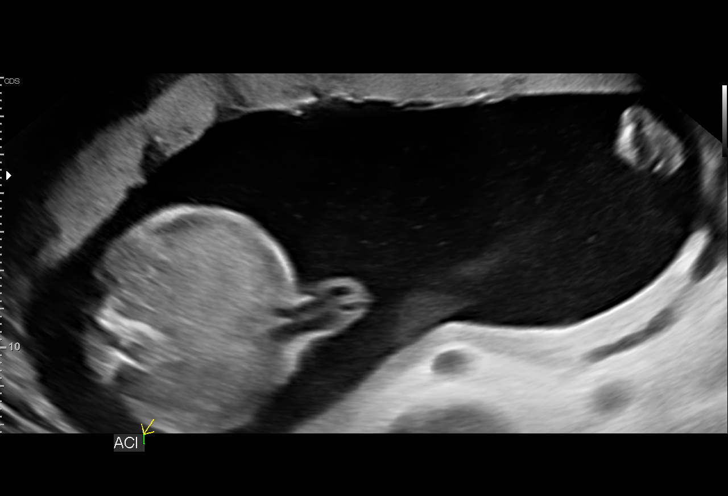
[im 43/82]
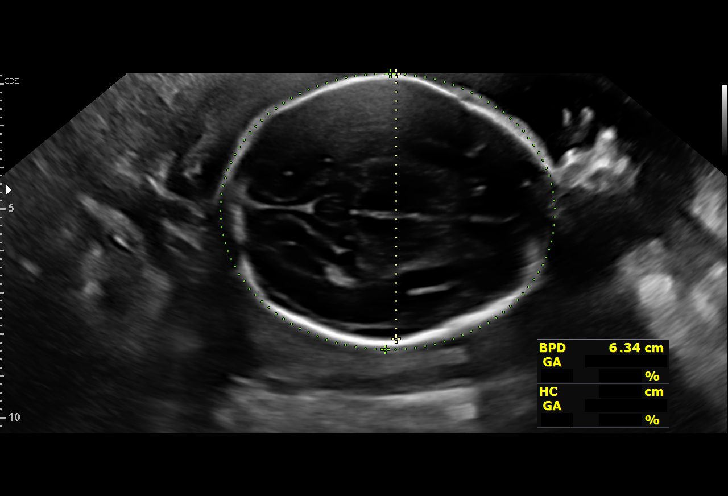
[im 49/82]
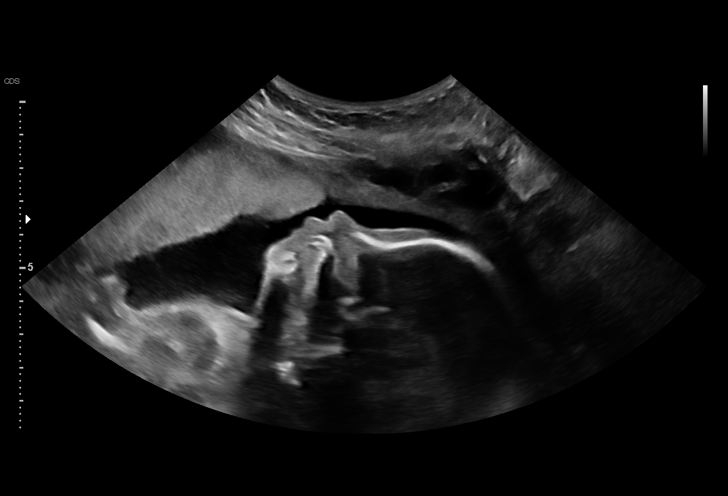
[im 55/82]
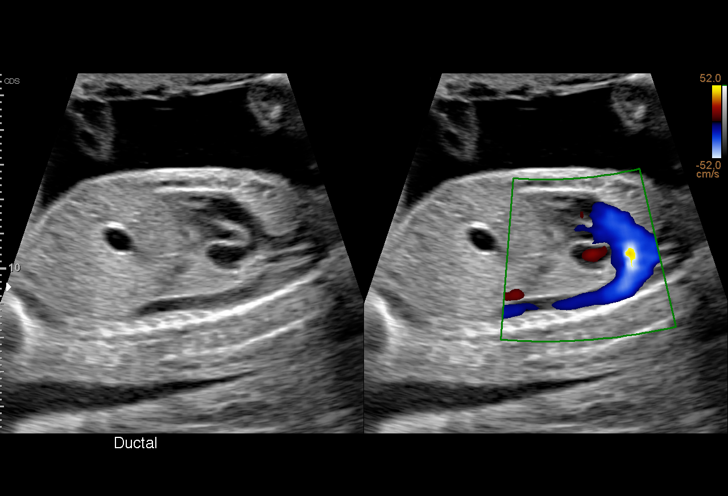
[im 61/82]
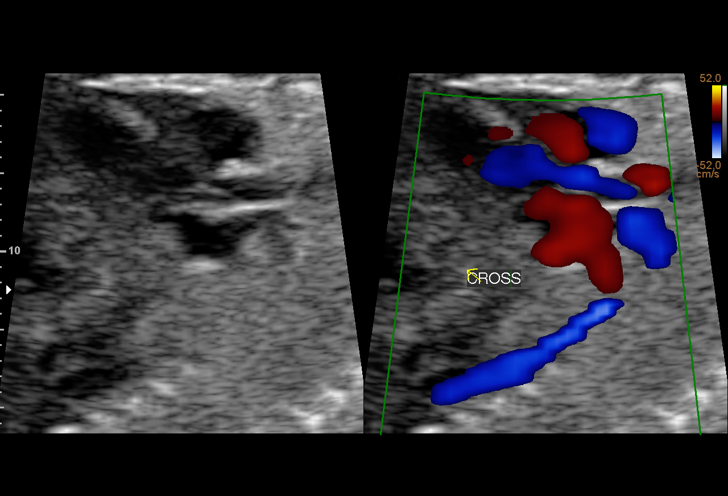
[im 67/82]
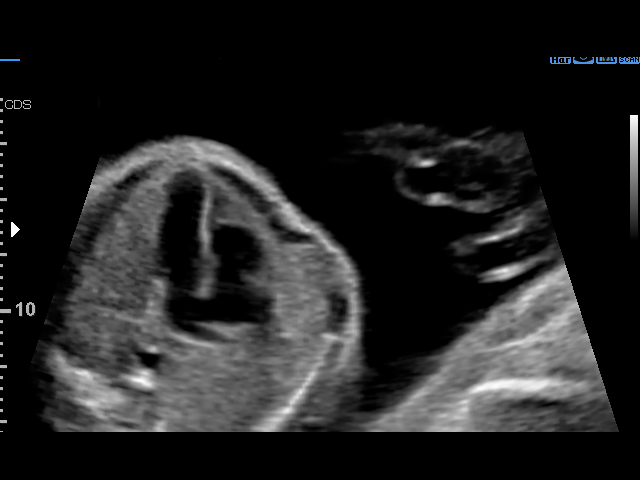
[im 73/82]
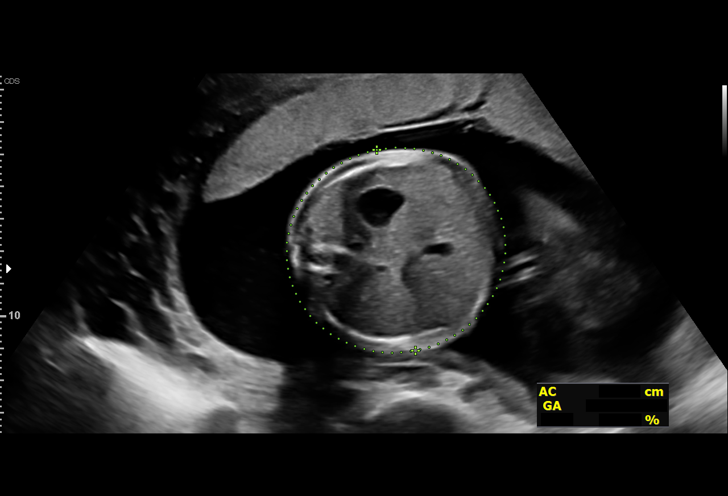
[im 79/82]
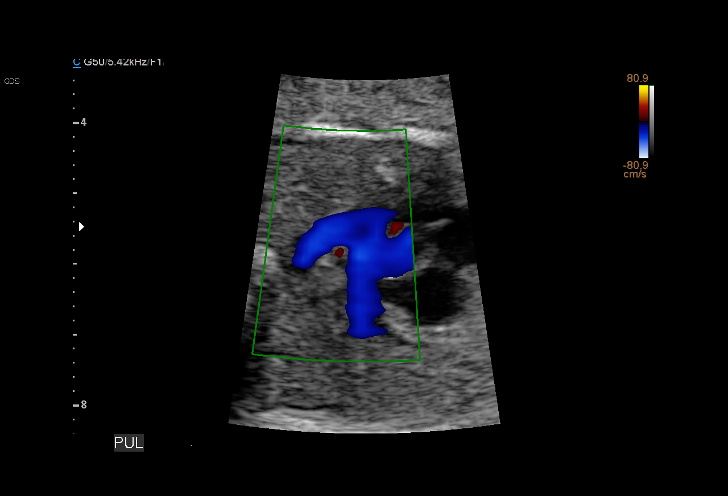

[13 of 28 positions shown; findings below may reference images not displayed]

HUJAIR

                   HUJAIR CNM

Indications

 24 weeks gestation of pregnancy
 Encounter for antenatal screening,             [9Y]
 unspecified
Fetal Evaluation

 Num Of Fetuses:         1
 Fetal Heart Rate(bpm):  133
 Cardiac Activity:       Observed
 Presentation:           Cephalic
 Placenta:               Anterior
 P. Cord Insertion:      Visualized, central

 Amniotic Fluid
 AFI FV:      Within normal limits

                             Largest Pocket(cm)

Biometry

 BPD:      63.4  mm     G. Age:  25w 5d         87  %    CI:        76.06   %    70 - 86
                                                         FL/HC:      18.3   %    18.7 -
 HC:      230.4  mm     G. Age:  25w 0d         61  %    HC/AC:      1.15        1.05 -
 AC:      201.2  mm     G. Age:  24w 5d         56  %    FL/BPD:     66.4   %    71 - 87
 FL:       42.1  mm     G. Age:  23w 5d         21  %    FL/AC:      20.9   %    20 - 24
 HUM:      37.5  mm     G. Age:  23w 1d         15  %
 CER:      27.7  mm     G. Age:  24w 5d         69  %
 LV:        3.5  mm
 CM:          5  mm
 Est. FW:     697  gm      1 lb 9 oz     48  %
OB History

 Gravidity:    3         Term:   2
 Living:       2
Gestational Age

 Clinical EDD:  24w 2d                                        EDD:   [DATE]
 U/S Today:     24w 6d                                        EDD:   [DATE]
 Best:          24w 2d     Det. By:  Clinical EDD             EDD:   [DATE]
Anatomy

 Cranium:               Appears normal         Aortic Arch:            Appears normal
 Cavum:                 Appears normal         Ductal Arch:            Appears normal
 Ventricles:            Appears normal         Diaphragm:              Appears normal
 Choroid Plexus:        Appears normal         Stomach:                Appears normal, left
                                                                       sided
 Cerebellum:            Appears normal         Abdomen:                Appears normal
 Posterior Fossa:       Appears normal         Abdominal Wall:         Appears nml (cord
                                                                       insert, abd wall)
 Nuchal Fold:           Appears normal         Cord Vessels:           Appears normal (3
                                                                       vessel cord)
 Face:                  Appears normal         Kidneys:                Appear normal
                        (orbits and profile)
 Lips:                  Appears normal         Bladder:                Appears normal
 Thoracic:              Appears normal         Spine:                  Appears normal
 Heart:                 Appears normal         Upper Extremities:      Appears normal
                        (4CH, axis, and
                        situs)
 RVOT:                  Appears normal         Lower Extremities:      Appears normal
 LVOT:                  Appears normal

 Other:  Appears to be female
Cervix Uterus Adnexa

 Cervix
 Length:           3.86  cm.
 Normal appearance by transabdominal scan.

 Right Ovary
 Within normal limits.

 Left Ovary
 Within normal limits.
Impression

 G3 P2.  Patient is here for fetal anatomy scan.

 On cell-free fetal DNA screening, the risks of fetal
 aneuploidies are not increased .

 We performed fetal anatomy scan. No makers of
 aneuploidies or fetal structural defects are seen. Fetal
 biometry is consistent with her previously-established dates.
 Amniotic fluid is normal and good fetal activity is seen.
 Female fetus is seen.  Patient understands the limitations of
 ultrasound in detecting fetal anomalies.
 Obstetric history significant for 2 term vaginal deliveries.

 On carrier screening, intermediate allele size for Fragile X
 syndrome was detected (45 CG G repeat allele) and this is
 not associated with increased risk of Fragile X syndrome of
 the fetus.  Fetus is female.
 I have reassured the patient of the findings
Recommendations

 Follow-up scans as clinically indicated.
                 HUJAIR

## 2020-10-12 ENCOUNTER — Other Ambulatory Visit: Payer: Self-pay | Admitting: *Deleted

## 2020-10-12 DIAGNOSIS — Z3490 Encounter for supervision of normal pregnancy, unspecified, unspecified trimester: Secondary | ICD-10-CM

## 2020-10-14 ENCOUNTER — Telehealth: Payer: Self-pay

## 2020-10-14 NOTE — Telephone Encounter (Signed)
Careers information officer to register patient. Ride scheduled for appt tomorrow AM. Pt has been notified by prenatal navigator Renea Ee. Rider Waiver will be signed at visit tomorrow.

## 2020-10-15 ENCOUNTER — Other Ambulatory Visit: Payer: Medicaid Other

## 2020-10-15 ENCOUNTER — Other Ambulatory Visit: Payer: Self-pay

## 2020-10-15 ENCOUNTER — Ambulatory Visit (INDEPENDENT_AMBULATORY_CARE_PROVIDER_SITE_OTHER): Payer: Medicaid Other | Admitting: Student

## 2020-10-15 VITALS — BP 104/76 | HR 97 | Wt 140.1 lb

## 2020-10-15 DIAGNOSIS — Z3490 Encounter for supervision of normal pregnancy, unspecified, unspecified trimester: Secondary | ICD-10-CM

## 2020-10-15 DIAGNOSIS — Z3A27 27 weeks gestation of pregnancy: Secondary | ICD-10-CM

## 2020-10-15 DIAGNOSIS — Z3492 Encounter for supervision of normal pregnancy, unspecified, second trimester: Secondary | ICD-10-CM

## 2020-10-15 NOTE — Progress Notes (Signed)
   PRENATAL VISIT NOTE  Subjective:  Molly Pratt is a 30 y.o. G3P2002 at [redacted]w[redacted]d being seen today for ongoing prenatal care.  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  Patient reports no complaints.  Contractions: Not present.  .  Movement: Present. Denies leaking of fluid.   The following portions of the patient's history were reviewed and updated as appropriate: allergies, current medications, past family history, past medical history, past social history, past surgical history and problem list.   Objective:   Vitals:   10/15/20 0845  BP: 104/76  Pulse: 97  Weight: 140 lb 1.6 oz (63.5 kg)    Fetal Status: Fetal Heart Rate (bpm): 145 Fundal Height: 27 cm Movement: Present     General:  Alert, oriented and cooperative. Patient is in no acute distress.  Skin: Skin is warm and dry. No rash noted.   Cardiovascular: Normal heart rate noted  Respiratory: Normal respiratory effort, no problems with respiration noted  Abdomen: Soft, gravid, appropriate for gestational age.  Pain/Pressure: Absent     Pelvic: Cervical exam deferred        Extremities: Normal range of motion.  Edema: None  Mental Status: Normal mood and affect. Normal behavior. Normal judgment and thought content.   Assessment and Plan:  Pregnancy: G3P2002 at [redacted]w[redacted]d  1. Encounter for supervision of low-risk pregnancy in second trimester    Preterm labor symptoms and general obstetric precautions including but not limited to vaginal bleeding, contractions, leaking of fluid and fetal movement were reviewed in detail with the patient. Please refer to After Visit Summary for other counseling recommendations.   Return in about 4 weeks (around 11/12/2020), or LROB with KK.  Future Appointments  Date Time Provider Department Center  11/16/2020  8:15 AM Currie Paris, NP [facility]    Marylene Land, [place]

## 2020-10-16 LAB — CBC
Hematocrit: 38.2 % (ref 34.0–46.6)
Hemoglobin: 12.8 g/dL (ref 11.1–15.9)
MCH: 29.6 pg (ref 26.6–33.0)
MCHC: 33.5 g/dL (ref 31.5–35.7)
MCV: 88 fL (ref 79–97)
Platelets: 170 10*3/uL (ref 150–450)
RBC: 4.33 x10E6/uL (ref 3.77–5.28)
RDW: 12.7 % (ref 11.7–15.4)
WBC: 5.7 10*3/uL (ref 3.4–10.8)

## 2020-10-16 LAB — GLUCOSE TOLERANCE, 2 HOURS W/ 1HR
Glucose, 1 hour: 121 mg/dL (ref 65–179)
Glucose, 2 hour: 107 mg/dL (ref 65–152)
Glucose, Fasting: 77 mg/dL (ref 65–91)

## 2020-10-16 LAB — HIV ANTIBODY (ROUTINE TESTING W REFLEX): HIV Screen 4th Generation wRfx: NONREACTIVE

## 2020-10-16 LAB — RPR: RPR Ser Ql: NONREACTIVE

## 2020-11-16 ENCOUNTER — Other Ambulatory Visit: Payer: Self-pay

## 2020-11-16 ENCOUNTER — Ambulatory Visit (INDEPENDENT_AMBULATORY_CARE_PROVIDER_SITE_OTHER): Payer: Medicaid Other | Admitting: Nurse Practitioner

## 2020-11-16 ENCOUNTER — Encounter: Payer: Self-pay | Admitting: Nurse Practitioner

## 2020-11-16 VITALS — BP 109/69 | HR 90 | Wt 145.8 lb

## 2020-11-16 DIAGNOSIS — Z3A31 31 weeks gestation of pregnancy: Secondary | ICD-10-CM

## 2020-11-16 DIAGNOSIS — Z23 Encounter for immunization: Secondary | ICD-10-CM | POA: Diagnosis not present

## 2020-11-16 DIAGNOSIS — R12 Heartburn: Secondary | ICD-10-CM

## 2020-11-16 DIAGNOSIS — O26893 Other specified pregnancy related conditions, third trimester: Secondary | ICD-10-CM

## 2020-11-16 DIAGNOSIS — Z3490 Encounter for supervision of normal pregnancy, unspecified, unspecified trimester: Secondary | ICD-10-CM | POA: Diagnosis not present

## 2020-11-16 DIAGNOSIS — Z789 Other specified health status: Secondary | ICD-10-CM

## 2020-11-16 NOTE — Addendum Note (Signed)
Addended by: Marjo Bicker on: 11/16/2020 09:55 AM   Modules accepted: Orders

## 2020-11-16 NOTE — Progress Notes (Signed)
    Subjective:  Molly Pratt is a 30 y.o. G3P2002 at [redacted]w[redacted]d being seen today for ongoing prenatal care.  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  Patient reports backache.  Contractions: Irritability. Vag. Bleeding: None.  Movement: Present. Denies leaking of fluid.   The following portions of the patient's history were reviewed and updated as appropriate: allergies, current medications, past family history, past medical history, past social history, past surgical history and problem list. Problem list updated.  Objective:   Vitals:   11/16/20 0827  BP: 109/69  Pulse: 90  Weight: 145 lb 12.8 oz (66.1 kg)    Fetal Status: Fetal Heart Rate (bpm): 139 Fundal Height: 32 cm Movement: Present     General:  Alert, oriented and cooperative. Patient is in no acute distress.  Skin: Skin is warm and dry. No rash noted.   Cardiovascular: Normal heart rate noted  Respiratory: Normal respiratory effort, no problems with respiration noted  Abdomen: Soft, gravid, appropriate for gestational age. Pain/Pressure: Present     Pelvic:  Cervical exam deferred        Extremities: Normal range of motion.  Edema: None  Mental Status: Normal mood and affect. Normal behavior. Normal judgment and thought content.  Back pain in center of her back at bra line  Urinalysis:      Assessment and Plan:  Pregnancy: G3P2002 at [redacted]w[redacted]d  1. Encounter for supervision of low-risk pregnancy, antepartum Advised Tums OTC for heartburn Had patient practice stretching exercise to address back pain Suggested pressure with tennis ball to the area also. Advised to call out of town pediatrician to let them know she is pregnant and will be expecting to bring her baby after birth for care. Reviewed where to call for vasectomy to make an appointment - [facility] for  vasectomy clinic. Plans no medication and no epidural for birth. Advised to get Covid booster which is due now. Will get TDAP today. Normal BP, baby is moving well  2. Language barrier In person interpreter present for entire visit.  Preterm labor symptoms and general obstetric precautions including but not limited to vaginal bleeding, contractions, leaking of fluid and fetal movement were reviewed in detail with the patient. Please refer to After Visit Summary for other counseling recommendations.  Return in about 2 weeks (around 11/30/2020) for in person ROB.  Nolene Bernheim, RN, MSN, NP-BC Nurse Practitioner, [facility] for [employer], [facility] 11/16/2020 8:53 AM

## 2020-11-16 NOTE — Patient Instructions (Addendum)
Get TUMS for heartburn.  [facility] Department  Vasectomy Clinic [phone]

## 2020-11-30 ENCOUNTER — Ambulatory Visit (INDEPENDENT_AMBULATORY_CARE_PROVIDER_SITE_OTHER): Payer: Medicaid Other | Admitting: Nurse Practitioner

## 2020-11-30 ENCOUNTER — Other Ambulatory Visit: Payer: Self-pay

## 2020-11-30 VITALS — BP 104/71 | HR 98 | Wt 148.3 lb

## 2020-11-30 DIAGNOSIS — O26893 Other specified pregnancy related conditions, third trimester: Secondary | ICD-10-CM

## 2020-11-30 DIAGNOSIS — Z603 Acculturation difficulty: Secondary | ICD-10-CM

## 2020-11-30 DIAGNOSIS — Z789 Other specified health status: Secondary | ICD-10-CM

## 2020-11-30 DIAGNOSIS — Z3A33 33 weeks gestation of pregnancy: Secondary | ICD-10-CM

## 2020-11-30 DIAGNOSIS — Z3490 Encounter for supervision of normal pregnancy, unspecified, unspecified trimester: Secondary | ICD-10-CM

## 2020-11-30 DIAGNOSIS — R12 Heartburn: Secondary | ICD-10-CM

## 2020-11-30 NOTE — Patient Instructions (Signed)
Informacin sobre el dispositivo intrauterino Intrauterine Device Information Un dispositivo intrauterino (DIU) es un dispositivo mdico que se coloca en el tero para impedir Firefighter. Es un dispositivo pequeo en forma de "T" del que cuelgan uno o dos hilos de [place]. Los hilos cuelgan de la parte inferior del tero (cuello uterino) para que el DIU pueda extraerse en el futuro. Hay dos tipos de DIU:  DIU hormonal. Este tipo de DIU est hecho de plstico y contiene la hormona progestina (progesterona sinttica). Un DIU hormonal puede durar entre 3 y 81aos.  DIU de cobre. Este tipo de DIU est envuelto por un alambre de cobre. Un DIU de cobre puede durar hasta 10aos. Cmo se coloca un DIU? El DIU se introduce a travs de la vagina, a travs del cuello uterino y se coloca en el tero con un procedimiento mdico menor. El procedimiento de colocacin del DIU puede variar segn el mdico y el hospital. Cmo funciona el DIU? La progesterona sinttica del DIU hormonal evita el embarazo de la siguiente manera:  Hace que el moco cervical se haga ms espeso a fin de evitar que los espermatozoides ingresen al tero.  Adelgaza el endometrio para evitar que el vulo fecundado se implante all. El cobre del DIU de cobre hace que el tero y las trompas de [place]opio produzcan un lquido que [employer] espermatozoides, lo que evita el [place]. Cules son las ventajas del DIU? [employer] tipos de [place] Un DIU:  Es muy efectivo para Neurosurgeon.  Es reversible. La mujer puede quedar embarazada poco tiempo despus de extraer el DIU.  Tiene bajo mantenimiento y puede dejarse colocado durante un largo [place].  No produce efectos secundarios relacionados con el estrgeno.  Se puede Psychologist, educational.  No est asociado con el aumento de [place].  Puede colocarse inmediatamente despus de un parto, un aborto provocado o un aborto espontneo. Ventajas del DIU  hormonal  Si se coloca antes de que transcurran 7 das del inicio del perodo menstrual, funciona de inmediato despus de la insercin. Si el DIU hormonal se coloca en cualquier otro momento del ciclo, ser necesario que utilice un mtodo anticonceptivo adicional durante los 7das posteriores a Education officer, community.  Puede hacer que los perodos menstruales sean ms livianos o que se interrumpan por completo.  Puede reducir los Best Buy y otras molestias de los perodos [place].  Puede utilizarse durante 3 a 5 aos, dependiendo del DIU que tenga. Ventajas del DIU de cobre  Funciona de inmediato despus de Education officer, community.  Puede utilizarse como un mtodo anticonceptivo de emergencia si se coloca antes de que pasen 5das despus de haber tenido relaciones sexuales sin proteccin.  No afecta a las hormonas naturales del cuerpo.  Se puede utilizar durante un mximo de 10 aos. Cules son las desventajas del DIU?  El DIU puede causar sangrado menstrual irregular durante un tiempo despus de su colocacin.  Es frecuente sentir dolor durante la colocacin y [place] clicos y sangrado vaginal despus de la colocacin.  El DIU puede cortar el tero (perforacin uterina) al colocarlo. Esto es poco frecuente.  Puede producirse enfermedad inflamatoria plvica (EIP) despus de la insercin de un DIU. La EIP es una infeccin del tero y las trompas de [place]. El DIU no provoca la infeccin. La infeccin normalmente proviene de una infeccin de transmisin sexual (ITS) desconocida. Esto es poco frecuente; si ocurre, suele producirse durante los primeros 20das despus de colocar el DIU.  El DIU  de cobre puede hacer que el flujo menstrual sea ms abundante y doloroso.  Los DIU no pueden prevenir las infecciones de transmisin sexual (ITS). Cmo se extrae el DIU?   Estar acostada boca arriba, con las rodillas flexionadas y los pies en los soportes (estribos).  Se introducir un  dispositivo en la vagina para separar las paredes vaginales (espculo). Esto permitir que el mdico vea los hilos del DIU.  El mdico usar un pequeo instrumento (frceps) para agarrar los hilos del DIU y tirar firmemente Contractor. Puede sentir algunas Big Lots extraccin del DIU. El mdico puede recomendarle que tome analgsicos de venta [place], como ibuprofeno, antes del procedimiento. Tambin puede tener una pequea cantidad de sangrado durante Time [employer] despus del procedimiento. El procedimiento de extraccin del DIU puede variar segn el mdico y el hospital. Es el DIU una opcin [place] para m? Si est interesada en obtener un DIU, hable al respecto con el mdico. El mdico se asegurar de que el uso de un DIU sea adecuado para usted y Chief Executive Officer informar ms [employer], las desventajas y los posibles efectos secundarios. Esto le permitir tomar una decisin sobre el dispositivo. Resumen  El dispositivo intrauterino (DIU) es un dispositivo mdico que se coloca en el tero para Location manager. Es un dispositivo pequeo en forma de "T" del que cuelgan uno o dos hilos de [place].  El DIU hormonal contiene la hormona progestina (progesterona sinttica). El DIU de cobre tiene un alambre de cobre enrollado alrededor.  La progesterona sinttica del DIU hormonal evita el embarazo al espesar el moco cervical y Geophysical data processor las paredes del tero. El cobre del DIU de cobre hace que el tero y las trompas de Falopio produzcan un lquido que [employer] espermatozoides, lo que evita el [place].  El DIU hormonal puede dejarse colocado de 3a 5aos. El DIU de cobre puede dejarse colocado hasta 10aos.  El mdico es quien coloca y extrae el DIU. Puede sentir un poco de dolor durante la colocacin y Furniture conservator/restorer. El mdico puede recomendarle que tome analgsicos de venta [place], como ibuprofeno, antes del procedimiento del DIU. Esta informacin no tiene Theme park manager el  consejo del mdico. Asegrese de hacerle al mdico cualquier pregunta que tenga. Document Revised: 03/30/2020 Document Reviewed: 03/30/2020 Elsevier Patient Education  2021 [employer].

## 2020-11-30 NOTE — Progress Notes (Signed)
    Subjective:  Molly Pratt is a 30 y.o. G3P2002 at [redacted]w[redacted]d being seen today for ongoing prenatal care.  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  Patient reports no complaints.  Contractions: Irritability. Vag. Bleeding: None.  Movement: Present. Denies leaking of fluid.   The following portions of the patient's history were reviewed and updated as appropriate: allergies, current medications, past family history, past medical history, past social history, past surgical history and problem list. Problem list updated.  Objective:   Vitals:   11/30/20 0922  BP: 104/71  Pulse: 98  Weight: 148 lb 4.8 oz (67.3 kg)    Fetal Status: Fetal Heart Rate (bpm): 142 Fundal Height: 36 cm Movement: Present     General:  Alert, oriented and cooperative. Patient is in no acute distress.  Skin: Skin is warm and dry. No rash noted.   Cardiovascular: Normal heart rate noted  Respiratory: Normal respiratory effort, no problems with respiration noted  Abdomen: Soft, gravid, appropriate for gestational age. Pain/Pressure: Present     Pelvic:  Cervical exam deferred        Extremities: Normal range of motion.  Edema: None  Mental Status: Normal mood and affect. Normal behavior. Normal judgment and thought content.   Urinalysis:      Assessment and Plan:  Pregnancy: G3P2002 at [redacted]w[redacted]d  1. Encounter for supervision of low-risk pregnancy, antepartum Partner did not get an appointment for a vasectomy - considering one more child Discussed LARC - had Nexplanon and did not like not having her period and headaches.  Discussed IUD and put info into AVS to consider having an IUD. Talking with navigator today about a crib Did not get the covid booster yet  2. Language barrier In person interpreter in the room for the visit today  3. Heartburn  during pregnancy in third trimester Doing better.  No meds and does not want medication.  Preterm labor symptoms and general obstetric precautions including but not limited to vaginal bleeding, contractions, leaking of fluid and fetal movement were reviewed in detail with the patient. Please refer to After Visit Summary for other counseling recommendations.  Return in about 16 days (around 12/16/2020) for vaginal swabs in person ROB.  Nolene Bernheim, RN, MSN, NP-BC Nurse Practitioner, [facility] for [employer], [facility] 11/30/2020 9:46 AM

## 2020-12-16 ENCOUNTER — Ambulatory Visit (INDEPENDENT_AMBULATORY_CARE_PROVIDER_SITE_OTHER): Payer: Medicaid Other | Admitting: Certified Nurse Midwife

## 2020-12-16 ENCOUNTER — Other Ambulatory Visit (HOSPITAL_COMMUNITY)
Admission: RE | Admit: 2020-12-16 | Discharge: 2020-12-16 | Disposition: A | Discharge status: Home / Self Care | Payer: Medicaid Other | Source: Ambulatory Visit | Attending: Certified Nurse Midwife | Admitting: Certified Nurse Midwife

## 2020-12-16 VITALS — BP 99/72 | HR 104 | Wt 152.0 lb

## 2020-12-16 DIAGNOSIS — Z3493 Encounter for supervision of normal pregnancy, unspecified, third trimester: Secondary | ICD-10-CM | POA: Diagnosis present

## 2020-12-16 DIAGNOSIS — Z3A36 36 weeks gestation of pregnancy: Secondary | ICD-10-CM

## 2020-12-16 NOTE — Progress Notes (Signed)
   PRENATAL VISIT NOTE  Subjective:  Molly Pratt is a 30 y.o. G3P2002 at [redacted]w[redacted]d being seen today for ongoing prenatal care.  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  Patient reports no complaints.  Contractions: Not present. Vag. Bleeding: None.  Movement: Present. Denies leaking of fluid.   The following portions of the patient's history were reviewed and updated as appropriate: allergies, current medications, past family history, past medical history, past social history, past surgical history and problem list.   Objective:   Vitals:   12/16/20 1048  BP: 99/72  Pulse: (!) 104  Weight: 152 lb (68.9 kg)    Fetal Status: Fetal Heart Rate (bpm): 140 Fundal Height: 37 cm Movement: Present  Presentation: Vertex  General:  Alert, oriented and cooperative. Patient is in no acute distress.  Skin: Skin is warm and dry. No rash noted.   Cardiovascular: Normal heart rate noted  Respiratory: Normal respiratory effort, no problems with respiration noted  Abdomen: Soft, gravid, appropriate for gestational age.  Pain/Pressure: Absent     Pelvic: Cervical exam deferred        Extremities: Normal range of motion.  Edema: None  Mental Status: Normal mood and affect. Normal behavior. Normal judgment and thought content.   Assessment and Plan:  Pregnancy: G3P2002 at [redacted]w[redacted]d 1. Encounter for supervision of low-risk pregnancy in third trimester - Doing well, no complaints and feeling vigorous fetal movement  2. [redacted] weeks gestation of pregnancy - Routine OB care - Culture, beta strep (group b only) - GC/Chlamydia probe amp ([place])not at [facility] - Anticipatory guidance re future visits and treatment of GBS if positive  Preterm labor symptoms and general obstetric precautions including but not limited to vaginal bleeding, contractions,  leaking of fluid and fetal movement were reviewed in detail with the patient. Please refer to After Visit Summary for other counseling recommendations.   Return in about 1 week (around 12/23/2020) for IN-PERSON, LOB.  Future Appointments  Date Time Provider Department Center  12/24/2020  9:40 AM Magnus Sinning, Sherryll Burger [facility]    Bernerd Limbo, CNM

## 2020-12-16 NOTE — Patient Instructions (Signed)
Group B Streptococcus Test During Pregnancy Why am I having this test? Routine testing, also called screening, for group B streptococcus (GBS) is recommended for all pregnant women between the 36th and 37th week of pregnancy. GBS is a type of bacteria that can be passed from mother to baby during childbirth. Screening will help guide whether or not you will need treatment during labor and delivery to prevent complications such as:  An infection in your uterus during labor.  An infection in your uterus after delivery.  A serious infection in your baby after delivery, such as pneumonia, meningitis, or sepsis. GBS screening is not often done before 36 weeks of pregnancy unless you go into labor prematurely. What happens if I have group B streptococcus? If testing shows that you have GBS, your health care provider will recommend treatment with IV antibiotics during labor and delivery. This treatment significantly decreases the risk of complications for you and your baby. If you have a planned C-section and you have GBS, you may not need to be treated with antibiotics because GBS is usually passed to babies after labor starts and your water breaks. If you are in labor or your water breaks before your C-section, it is possible for GBS to get into your uterus and be passed to your baby, so you might need treatment. Is there a chance I may not need to be tested? You may not need to be tested for GBS if:  You have a urine test that shows GBS before 36 to 37 weeks.  You had a baby with GBS infection after a previous delivery. In these cases, you will automatically be treated for GBS during labor and delivery. What is being tested? This test is done to check if you have group B streptococcus in your vagina or rectum. What kind of sample is taken? To collect samples for this test, your health care provider will swab your vagina and rectum with a cotton swab. The sample is then sent to the lab to see if  GBS is present. What happens during the test?  You will remove your clothing from the waist down.  You will lie down on an exam table in the same position as you would for a pelvic exam.  Your health care provider will swab your vagina and rectum to collect samples for a culture test.  You will be able to go home after the test and do all your usual activities.   How are the results reported? The test results are reported as positive or negative. What do the results mean?  A positive test means you are at risk for passing GBS to your baby during labor and delivery. Your health care provider will recommend that you are treated with an IV antibiotic during labor and delivery.  A negative test means you are at very low risk of passing GBS to your baby. There is still a low risk of passing GBS to your baby because sometimes test results may report that you do not have a condition when you do (false-negative result) or there is a chance that you may become infected with GBS after the test is done. You most likely will not need to be treated with an antibiotic during labor and delivery. Talk with your health care provider about what your results mean. Questions to ask your health care provider Ask your health care provider, or the department that is doing the test:  When will my results be ready?  How will   I get my results?  What are my treatment options? Summary  Routine testing (screening) for group B streptococcus (GBS) is recommended for all pregnant women between the 36th and 37th week of pregnancy.  GBS is a type of bacteria that can be passed from mother to baby during childbirth.  If testing shows that you have GBS, your health care provider will recommend that you are treated with IV antibiotics during labor and delivery. This treatment almost always prevents infection in newborns. This information is not intended to replace advice given to you by your health care provider. Make  sure you discuss any questions you have with your health care provider. Document Revised: 06/16/2020 Document Reviewed: 09/12/2018 Elsevier Patient Education  2021 Elsevier Inc.   Rosen's Emergency Medicine: Concepts and Clinical Practice (9th ed., pp. 2296- 2312). Elsevier.">  Braxton Hicks Contractions Contractions of the uterus can occur throughout pregnancy, but they are not always a sign that you are in labor. You may have practice contractions called Braxton Hicks contractions. These false labor contractions are sometimes confused with true labor. What are Braxton Hicks contractions? Braxton Hicks contractions are tightening movements that occur in the muscles of the uterus before labor. Unlike true labor contractions, these contractions do not result in opening (dilation) and thinning of the cervix. Toward the end of pregnancy (32-34 weeks), Braxton Hicks contractions can happen more often and may become stronger. These contractions are sometimes difficult to tell apart from true labor because they can be very uncomfortable. You should not feel embarrassed if you go to the hospital with false labor. Sometimes, the only way to tell if you are in true labor is for your health care provider to look for changes in the cervix. The health care provider will do a physical exam and may monitor your contractions. If you are not in true labor, the exam should show that your cervix is not dilating and your water has not broken. If there are no other health problems associated with your pregnancy, it is completely safe for you to be sent home with false labor. You may continue to have Braxton Hicks contractions until you go into true labor. How to tell the difference between true labor and false labor True labor  Contractions last 30-70 seconds.  Contractions become very regular.  Discomfort is usually felt in the top of the uterus, and it spreads to the lower abdomen and low back.  Contractions do  not go away with walking.  Contractions usually become more intense and increase in frequency.  The cervix dilates and gets thinner. False labor  Contractions are usually shorter and not as strong as true labor contractions.  Contractions are usually irregular.  Contractions are often felt in the front of the lower abdomen and in the groin.  Contractions may go away when you walk around or change positions while lying down.  Contractions get weaker and are shorter-lasting as time goes on.  The cervix usually does not dilate or become thin. Follow these instructions at home:  Take over-the-counter and prescription medicines only as told by your health care provider.  Keep up with your usual exercises and follow other instructions from your health care provider.  Eat and drink lightly if you think you are going into labor.  If Braxton Hicks contractions are making you uncomfortable: ? Change your position from lying down or resting to walking, or change from walking to resting. ? Sit and rest in a tub of warm water. ? Drink   enough fluid to keep your urine pale yellow. Dehydration may cause these contractions. ? Do slow and deep breathing several times an hour.  Keep all follow-up prenatal visits as told by your health care provider. This is important.   Contact a health care provider if:  You have a fever.  You have continuous pain in your abdomen. Get help right away if:  Your contractions become stronger, more regular, and closer together.  You have fluid leaking or gushing from your vagina.  You pass blood-tinged mucus (bloody show).  You have bleeding from your vagina.  You have low back pain that you never had before.  You feel your baby's head pushing down and causing pelvic pressure.  Your baby is not moving inside you as much as it used to. Summary  Contractions that occur before labor are called Braxton Hicks contractions, false labor, or practice  contractions.  Braxton Hicks contractions are usually shorter, weaker, farther apart, and less regular than true labor contractions. True labor contractions usually become progressively stronger and regular, and they become more frequent.  Manage discomfort from [facility] contractions by changing position, resting in a warm bath, drinking plenty of water, or practicing deep breathing. This information is not intended to replace advice given to you by your health care provider. Make sure you discuss any questions you have with your health care provider. Document Revised: 07/28/2017 Document Reviewed: 12/29/2016 Elsevier Patient Education  2021 [employer].

## 2020-12-17 LAB — GC/CHLAMYDIA PROBE AMP (~~LOC~~) NOT AT ARMC
Chlamydia: NEGATIVE
Comment: NEGATIVE
Comment: NORMAL
Neisseria Gonorrhea: NEGATIVE

## 2020-12-20 LAB — CULTURE, BETA STREP (GROUP B ONLY): Strep Gp B Culture: NEGATIVE

## 2020-12-24 ENCOUNTER — Ambulatory Visit (INDEPENDENT_AMBULATORY_CARE_PROVIDER_SITE_OTHER): Payer: Medicaid Other | Admitting: Medical

## 2020-12-24 ENCOUNTER — Encounter: Payer: Self-pay | Admitting: Medical

## 2020-12-24 VITALS — BP 102/75 | HR 88 | Wt 152.6 lb

## 2020-12-24 DIAGNOSIS — Z148 Genetic carrier of other disease: Secondary | ICD-10-CM

## 2020-12-24 DIAGNOSIS — Z3493 Encounter for supervision of normal pregnancy, unspecified, third trimester: Secondary | ICD-10-CM

## 2020-12-24 DIAGNOSIS — Z3A37 37 weeks gestation of pregnancy: Secondary | ICD-10-CM

## 2020-12-24 DIAGNOSIS — Z789 Other specified health status: Secondary | ICD-10-CM

## 2020-12-24 NOTE — Progress Notes (Signed)
   PRENATAL VISIT NOTE  Subjective:  Molly Pratt is a 30 y.o. G3P2002 at [redacted]w[redacted]d being seen today for ongoing prenatal care.  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  Patient reports no complaints.  Contractions: Not present. Vag. Bleeding: None.  Movement: Present. Denies leaking of fluid.   The following portions of the patient's history were reviewed and updated as appropriate: allergies, current medications, past family history, past medical history, past social history, past surgical history and problem list.   Objective:   Vitals:   12/24/20 0939  BP: 102/75  Pulse: 88  Weight: 152 lb 9.6 oz (69.2 kg)    Fetal Status: Fetal Heart Rate (bpm): 142 Fundal Height: 38 cm Movement: Present     General:  Alert, oriented and cooperative. Patient is in no acute distress.  Skin: Skin is warm and dry. No rash noted.   Cardiovascular: Normal heart rate noted  Respiratory: Normal respiratory effort, no problems with respiration noted  Abdomen: Soft, gravid, appropriate for gestational age.  Pain/Pressure: Absent     Pelvic: Cervical exam deferred        Extremities: Normal range of motion.  Edema: None  Mental Status: Normal mood and affect. Normal behavior. Normal judgment and thought content.   Assessment and Plan:  Pregnancy: G3P2002 at [redacted]w[redacted]d 1. Encounter for supervision of low-risk pregnancy in third trimester - Reviewed normal cultures from last visit  - Discussed IP LARC and other MOC options, still undecided  - Considering non-hormonal options, discussed Paraguard and Phexxi   2. Language barrier - Interpreter present   3. 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 [redacted] weeks gestation of pregnancy  Term labor symptoms and general obstetric precautions including but not limited to  vaginal bleeding, contractions, leaking of fluid and fetal movement were reviewed in detail with the patient. Please refer to After Visit Summary for other counseling recommendations.   Return in about 1 week (around 12/31/2020) for LOB, In-Person.  No future appointments.  Vonzella Nipple, PA-C

## 2020-12-24 NOTE — Progress Notes (Signed)
Patient states that she is doing well. Denies any pain/pressure, vaginal bleeding, etc. No comments or concerns. Caught up on labs

## 2020-12-24 NOTE — Patient Instructions (Signed)
Evaluación de los movimientos fetales °Fetal Movement Counts °Nombre del paciente: ________________________________________________ Fecha de parto estimada: ____________________ ° °¿Qué es una evaluación de los movimientos fetales? °Una evaluación de los movimientos fetales es el registro del número de veces que siente que el bebé se mueve durante un cierto período de tiempo. Esto también se puede denominar recuento de patadas fetales. Una evaluación de movimientos fetales se recomienda a todas las embarazadas. Es posible que le indiquen que comience a evaluar los movimientos fetales desde la semana 28 de embarazo. °Preste atención cuando sienta que el bebé está más activo. Podrá detectar los ciclos en que el bebé duerme y está despierto. También podrá detectar que ciertas cosas hacen que su bebé se mueva más. Deberá realizar una evaluación de los movimientos fetales en las siguientes situaciones: °· Cuando el bebé está más activo habitualmente. °· A la misma hora, todos los días. °Un buen momento para evaluar los movimientos fetales es cuando está descansando, después de haber comido y bebido algo. °¿Cómo debo contar los movimientos fetales? °1. Encuentre un lugar tranquilo y cómodo. Siéntese o acuéstese de lado. °2. Anote la fecha, la hora de inicio y de finalización y la cantidad de movimientos que sintió entre esas dos horas. Lleve esta información a las visitas de control. °3. Anote la hora de inicio cuando sienta el primer movimiento. °4. Cuente las pataditas, revoloteos, chasquidos, vueltas o pinchazos. Debe sentir al menos 10 movimientos. °5. Puede dejar de contar después de haber sentido 10 movimientos o de haber contado durante 2 horas. Anote la hora de finalización. °6. Si no siente 10 movimientos en 2 horas, comuníquese con su médico para obtener más indicaciones. Es posible que el médico quiera realizar estudios adicionales para evaluar el bienestar del bebé. °Comuníquese con un médico si: °· Siente  menos de 10 movimientos en 2 horas. °· El bebé no se mueve tanto como suele hacerlo. °Fecha: ____________ Hora de inicio: ____________ Hora de finalización: ____________ Movimientos: ____________ °Fecha: ____________ Hora de inicio: ____________ Hora de finalización: ____________ Movimientos: ____________ °Fecha: ____________ Hora de inicio: ____________ Hora de finalización: ____________ Movimientos: ____________ °Fecha: ____________ Hora de inicio: ____________ Hora de finalización: ____________ Movimientos: ____________ °Fecha: ____________ Hora de inicio: ____________ Hora de finalización: ____________ Movimientos: ____________ °Fecha: ____________ Hora de inicio: ____________ Hora de finalización: ____________ Movimientos: ____________ °Fecha: ____________ Hora de inicio: ____________ Hora de finalización: ____________ Movimientos: ____________ °Fecha: ____________ Hora de inicio: ____________ Hora de finalización: ____________ Movimientos: ____________ °Fecha: ____________ Hora de inicio: ____________ Hora de finalización: ____________ Movimientos: ____________ °Esta información no tiene como fin reemplazar el consejo del médico. Asegúrese de hacerle al médico cualquier pregunta que tenga. °Document Revised: 06/11/2019 Document Reviewed: 06/11/2019 °Elsevier Patient Education © 2021 Elsevier Inc. °Rosen's Emergency Medicine: Concepts and Clinical Practice (9th ed., pp. 2296- 2312). Elsevier.">  °Contracciones de Braxton Hicks °Braxton Hicks Contractions °Las contracciones del útero pueden presentarse durante todo el embarazo, pero no siempre indican que la mujer está de parto. Es posible que usted haya tenido contracciones de práctica llamadas "contracciones de Braxton Hicks". A veces, se las confunde con el parto real. °¿Qué son las contracciones de Braxton Hicks? °Las contracciones de Braxton Hicks son espasmos que se producen en los músculos del útero antes del parto. A diferencia de las contracciones del  parto verdadero, estas no producen el agrandamiento (la dilatación) ni el afinamiento del cuello uterino. Hacia el final del embarazo (entre las semanas 32 y 34), las contracciones de Braxton Hicks pueden presentarse más seguido y tornarse más intensas. A veces, resulta difícil distinguirlas del   parto verdadero porque pueden ser muy molestas. No debe sentirse avergonzada si concurre al hospital con falso parto. °En ocasiones, la única forma de saber si el trabajo de parto es verdadero es que el médico determine si hay cambios en el cuello del útero. El médico le hará un examen físico y quizás le controle las contracciones. Si usted no está de parto verdadero, el examen debe indicar que el cuello uterino no está dilatado y que usted no ha roto bolsa. °Si no hay otros problemas de salud asociados con su embarazo, no habrá inconvenientes si la envían a su casa con un falso parto. Es posible que las contracciones de Braxton Hicks continúen hasta que se desencadene el parto verdadero. °Cómo diferenciar el trabajo de parto falso del verdadero °Trabajo de parto verdadero °· Las contracciones duran de 30 a 70 segundos. °· Las contracciones pueden tornarse muy regulares. °· La molestia generalmente se siente en la parte superior del útero y se extiende hacia la zona baja del abdomen y hacia la cintura. °· Las contracciones no desaparecen cuando usted camina. °· Las contracciones generalmente se hacen más intensas y aumentan en frecuencia. °· El cuello uterino se dilata y se afina. °Parto falso °· En general, las contracciones son más cortas y no tan intensas como las del parto verdadero. °· En general, las contracciones son irregulares. °· A menudo, las contracciones se sienten en la parte delantera de la parte baja del abdomen y en la ingle. °· Las contracciones pueden desaparecer cuando usted camina o cambia de posición mientras está acostada. °· Las contracciones se vuelven más débiles y su duración es menor a medida que  transcurre el tiempo. °· En general, el cuello uterino no se dilata ni se afina. °Siga estas indicaciones en su casa: °· Tome los medicamentos de venta libre y los recetados solamente como se lo haya indicado el médico. °· Continúe haciendo los ejercicios habituales y siga las demás indicaciones que el médico le dé. °· Coma y beba con moderación si cree que está de parto. °· Si las contracciones de Braxton Hicks le provocan incomodidad: °? Cambie de posición: si está acostada o descansando, camine; si está caminando, descanse. °? Siéntese y descanse en una bañera con agua tibia. °? Beba suficiente líquido como para mantener la orina de color amarillo pálido. La deshidratación puede provocar contracciones. °? Respire lenta y profundamente varias veces por hora. °· Vaya a todas las visitas de control prenatales y de control como se lo haya indicado el médico. Esto es importante.   °Comuníquese con un médico si: °· Tiene fiebre. °· Siente dolor constante en el abdomen. °Solicite ayuda de inmediato si: °· Las contracciones se intensifican, se hacen más regulares y cercanas entre sí. °· Tiene una pérdida de líquido por la vagina. °· Elimina una mucosidad sanguinolenta (pérdida del tapón mucoso). °· Tiene una hemorragia vaginal. °· Tiene un dolor en la zona lumbar que nunca tuvo antes. °· Siente que la cabeza del bebé empuja hacia abajo y ejerce presión en la zona pélvica. °· El bebé no se mueve tanto como antes. °Resumen °· Las contracciones que se presentan antes del parto se conocen como contracciones de Braxton Hicks, falso parto o contracciones de práctica. °· En general, las contracciones de Braxton Hicks son más cortas, más débiles, con más tiempo entre una y otra, y menos regulares que las contracciones del parto verdadero. Las contracciones del parto verdadero se intensifican progresivamente y se tornan regulares y más frecuentes. °· Para controlar la molestia que   producen las contracciones de Braxton Hicks,  puede cambiar de posición, darse un baño templado y descansar, beber mucha agua o practicar la respiración profunda. °Esta información no tiene como fin reemplazar el consejo del médico. Asegúrese de hacerle al médico cualquier pregunta que tenga. °Document Revised: 11/24/2017 Document Reviewed: 03/27/2017 °Elsevier Patient Education © 2021 Elsevier Inc. ° °

## 2020-12-31 ENCOUNTER — Encounter: Payer: Self-pay | Admitting: General Practice

## 2021-01-04 ENCOUNTER — Encounter: Payer: Medicaid Other | Admitting: Obstetrics and Gynecology

## 2021-01-07 ENCOUNTER — Other Ambulatory Visit: Payer: Medicaid Other

## 2021-01-07 ENCOUNTER — Encounter (HOSPITAL_COMMUNITY): Payer: Self-pay | Admitting: *Deleted

## 2021-01-07 ENCOUNTER — Encounter: Payer: Self-pay | Admitting: Obstetrics and Gynecology

## 2021-01-07 ENCOUNTER — Other Ambulatory Visit: Payer: Self-pay | Admitting: Obstetrics and Gynecology

## 2021-01-07 ENCOUNTER — Ambulatory Visit (INDEPENDENT_AMBULATORY_CARE_PROVIDER_SITE_OTHER): Payer: Medicaid Other | Admitting: Obstetrics and Gynecology

## 2021-01-07 ENCOUNTER — Telehealth (HOSPITAL_COMMUNITY): Payer: Self-pay | Admitting: *Deleted

## 2021-01-07 ENCOUNTER — Other Ambulatory Visit: Payer: Self-pay

## 2021-01-07 VITALS — BP 105/73 | HR 101 | Wt 155.1 lb

## 2021-01-07 DIAGNOSIS — Z3A39 39 weeks gestation of pregnancy: Secondary | ICD-10-CM

## 2021-01-07 DIAGNOSIS — Z3493 Encounter for supervision of normal pregnancy, unspecified, third trimester: Secondary | ICD-10-CM

## 2021-01-07 DIAGNOSIS — Z789 Other specified health status: Secondary | ICD-10-CM

## 2021-01-07 NOTE — Progress Notes (Signed)
   PRENATAL VISIT NOTE  Subjective:  Molly Pratt is a 30 y.o. G3P2002 at [redacted]w[redacted]d being seen today for ongoing prenatal care.  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  Patient reports pressure.  Contractions: Irritability. Vag. Bleeding: None.  Movement: Present. Denies leaking of fluid.   The following portions of the patient's history were reviewed and updated as appropriate: allergies, current medications, past family history, past medical history, past social history, past surgical history and problem list.   Objective:   Vitals:   01/07/21 0909  BP: 105/73  Pulse: (!) 101  Weight: 155 lb 1.6 oz (70.4 kg)    Fetal Status: Fetal Heart Rate (bpm): 154   Movement: Present     General:  Alert, oriented and cooperative. Patient is in no acute distress.  Skin: Skin is warm and dry. No rash noted.   Cardiovascular: Normal heart rate noted  Respiratory: Normal respiratory effort, no problems with respiration noted  Abdomen: Soft, gravid, appropriate for gestational age.  Pain/Pressure: Present     Pelvic: Cervical exam deferred        Extremities: Normal range of motion.  Edema: Trace  Mental Status: Normal mood and affect. Normal behavior. Normal judgment and thought content.   Assessment and Plan:  Pregnancy: G3P2002 at 107w1d 1. Encounter for supervision of low-risk pregnancy in third trimester -declines cervical exam today  -cephalic by Leopold's   2. Language barrier - in person interpreter used   3. [redacted] weeks gestation of pregnancy   Term labor symptoms and general obstetric precautions including but not limited to vaginal bleeding, contractions, leaking of fluid and fetal movement were reviewed in detail with the patient. Please refer to After Visit Summary for other counseling recommendations.   No  follow-ups on file.  Future Appointments  Date Time Provider Department Center  01/07/2021 10:15 AM Gita Kudo, MD [facility]  01/14/2021  8:15 AM [facility]  01/14/2021 10:15 AM Warden Fillers, MD [facility]  01/18/2021 10:35 AM [place] Bing, MD [facility]  01/20/2021  8:15 AM [facility]    Gita Kudo, MD

## 2021-01-07 NOTE — Telephone Encounter (Signed)
[id number] interpreter number

## 2021-01-13 ENCOUNTER — Other Ambulatory Visit: Payer: Self-pay | Admitting: Advanced Practice Midwife

## 2021-01-14 ENCOUNTER — Ambulatory Visit: Payer: Medicaid Other | Admitting: *Deleted

## 2021-01-14 ENCOUNTER — Ambulatory Visit (INDEPENDENT_AMBULATORY_CARE_PROVIDER_SITE_OTHER): Payer: Medicaid Other

## 2021-01-14 ENCOUNTER — Ambulatory Visit (INDEPENDENT_AMBULATORY_CARE_PROVIDER_SITE_OTHER): Payer: Medicaid Other | Admitting: Obstetrics and Gynecology

## 2021-01-14 ENCOUNTER — Other Ambulatory Visit: Payer: Self-pay

## 2021-01-14 VITALS — BP 109/82 | HR 98 | Wt 155.3 lb

## 2021-01-14 DIAGNOSIS — Z148 Genetic carrier of other disease: Secondary | ICD-10-CM

## 2021-01-14 DIAGNOSIS — Z3490 Encounter for supervision of normal pregnancy, unspecified, unspecified trimester: Secondary | ICD-10-CM

## 2021-01-14 DIAGNOSIS — Z789 Other specified health status: Secondary | ICD-10-CM

## 2021-01-14 DIAGNOSIS — Z3A40 40 weeks gestation of pregnancy: Secondary | ICD-10-CM

## 2021-01-14 DIAGNOSIS — O480 Post-term pregnancy: Secondary | ICD-10-CM

## 2021-01-14 DIAGNOSIS — Z603 Acculturation difficulty: Secondary | ICD-10-CM

## 2021-01-14 NOTE — Progress Notes (Signed)
IOL scheduled on 5/26.

## 2021-01-14 NOTE — Progress Notes (Signed)

## 2021-01-14 NOTE — Progress Notes (Signed)
   PRENATAL VISIT NOTE  Subjective:  Molly Pratt is a 30 y.o. G3P2002 at [redacted]w[redacted]d being seen today for ongoing prenatal care.  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  Patient doing well with no acute concerns today. She reports no complaints.  Contractions: Irregular. Vag. Bleeding: None.  Movement: Present. Denies leaking of fluid.   The following portions of the patient's history were reviewed and updated as appropriate: allergies, current medications, past family history, past medical history, past social history, past surgical history and problem list. Problem list updated.  Objective:   Vitals:   01/14/21 0914  BP: 109/82  Pulse: 98  Weight: 155 lb 4.8 oz (70.4 kg)    Fetal Status: Fetal Heart Rate (bpm): RNST   Movement: Present     General:  Alert, oriented and cooperative. Patient is in no acute distress.  Skin: Skin is warm and dry. No rash noted.   Cardiovascular: Normal heart rate noted  Respiratory: Normal respiratory effort, no problems with respiration noted  Abdomen: Soft, gravid, appropriate for gestational age.  Pain/Pressure: Present     Pelvic: Cervical exam deferred        Extremities: Normal range of motion.     Mental Status:  Normal mood and affect. Normal behavior. Normal judgment and thought content.   Assessment and Plan:  Pregnancy: G3P2002 at [redacted]w[redacted]d  1. Encounter for supervision of low-risk pregnancy, antepartum   2. [redacted] weeks gestation of pregnancy   3. Language barrier Interpreter present  4. 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  Please refer to After Visit  Summary for other counseling recommendations.  Pt has previously scheduled ob visit on 5/23 and will get NST/BPP that day as well  Return for IOL on 5/26.   Mariel Aloe, MD Faculty Attending Center for [facility]

## 2021-01-18 ENCOUNTER — Ambulatory Visit: Payer: Medicaid Other | Admitting: *Deleted

## 2021-01-18 ENCOUNTER — Ambulatory Visit (INDEPENDENT_AMBULATORY_CARE_PROVIDER_SITE_OTHER): Payer: Medicaid Other

## 2021-01-18 ENCOUNTER — Ambulatory Visit (INDEPENDENT_AMBULATORY_CARE_PROVIDER_SITE_OTHER): Payer: Medicaid Other | Admitting: Obstetrics and Gynecology

## 2021-01-18 ENCOUNTER — Other Ambulatory Visit: Payer: Self-pay

## 2021-01-18 VITALS — BP 119/82 | HR 89 | Wt 155.5 lb

## 2021-01-18 DIAGNOSIS — Z3A40 40 weeks gestation of pregnancy: Secondary | ICD-10-CM

## 2021-01-18 DIAGNOSIS — O480 Post-term pregnancy: Secondary | ICD-10-CM

## 2021-01-18 DIAGNOSIS — Z3490 Encounter for supervision of normal pregnancy, unspecified, unspecified trimester: Secondary | ICD-10-CM

## 2021-01-18 DIAGNOSIS — Z789 Other specified health status: Secondary | ICD-10-CM

## 2021-01-18 NOTE — Progress Notes (Signed)
   PRENATAL VISIT NOTE  Subjective:  Molly Pratt is a 30 y.o. G3P2002 at [redacted]w[redacted]d being seen today for ongoing prenatal care.  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  Patient reports no complaints.  Contractions: Irregular. Vag. Bleeding: None.  Movement: Present. Denies leaking of fluid.   The following portions of the patient's history were reviewed and updated as appropriate: allergies, current medications, past family history, past medical history, past social history, past surgical history and problem list.   Objective:   Vitals:   01/18/21 1059  BP: 119/82  Pulse: 89  Weight: 155 lb 8 oz (70.5 kg)    Fetal Status: Fetal Heart Rate (bpm): NST   Movement: Present     General:  Alert, oriented and cooperative. Patient is in no acute distress.  Skin: Skin is warm and dry. No rash noted.   Cardiovascular: Normal heart rate noted  Respiratory: Normal respiratory effort, no problems with respiration noted  Abdomen: Soft, gravid, appropriate for gestational age.  Pain/Pressure: Present     Pelvic: Cervical exam deferred        Extremities: Normal range of motion.  Edema: Trace  Mental Status: Normal mood and affect. Normal behavior. Normal judgment and thought content.   Assessment and Plan:  Pregnancy: G3P2002 at [redacted]w[redacted]d 1. Encounter for supervision of low-risk pregnancy, antepartum GBS negative  2. Post term pregnancy, antepartum condition or complication Patient desired sooner IOL and I was able to move her up to 2345 today.  3. Language barrier Interpreter used  4. Post-term pregnancy, 40-42 weeks of gestation bpp 10/10, cephalic  5. [redacted] weeks gestation of pregnancy  Term labor symptoms and general obstetric precautions including but not limited to vaginal bleeding,  contractions, leaking of fluid and fetal movement were reviewed in detail with the patient. Please refer to After Visit Summary for other counseling recommendations.   Return if symptoms worsen or fail to improve.  Future Appointments  Date Time Provider Department Center  01/19/2021 12:00 AM [facility] SCHED ROOM [facility] None  01/19/2021  9:45 AM [facility]-SCREENING [facility] None    Conroe Bing, MD

## 2021-01-19 ENCOUNTER — Other Ambulatory Visit: Payer: Self-pay

## 2021-01-19 ENCOUNTER — Inpatient Hospital Stay (HOSPITAL_COMMUNITY)
Admission: AD | Admit: 2021-01-19 | Discharge: 2021-01-21 | DRG: 807 | Disposition: A | Discharge status: Home / Self Care | Payer: Medicaid Other | Attending: Obstetrics & Gynecology | Admitting: Obstetrics & Gynecology

## 2021-01-19 ENCOUNTER — Other Ambulatory Visit (HOSPITAL_COMMUNITY): Payer: Medicaid Other | Attending: Family Medicine

## 2021-01-19 ENCOUNTER — Encounter (HOSPITAL_COMMUNITY): Payer: Self-pay | Admitting: Obstetrics & Gynecology

## 2021-01-19 ENCOUNTER — Inpatient Hospital Stay (HOSPITAL_COMMUNITY): Payer: Medicaid Other

## 2021-01-19 DIAGNOSIS — O480 Post-term pregnancy: Principal | ICD-10-CM | POA: Diagnosis present

## 2021-01-19 DIAGNOSIS — Z789 Other specified health status: Secondary | ICD-10-CM | POA: Diagnosis present

## 2021-01-19 DIAGNOSIS — Z3A40 40 weeks gestation of pregnancy: Secondary | ICD-10-CM | POA: Diagnosis not present

## 2021-01-19 DIAGNOSIS — Z20822 Contact with and (suspected) exposure to covid-19: Secondary | ICD-10-CM | POA: Diagnosis present

## 2021-01-19 DIAGNOSIS — Q992 Fragile X chromosome: Secondary | ICD-10-CM | POA: Diagnosis not present

## 2021-01-19 DIAGNOSIS — Z148 Genetic carrier of other disease: Secondary | ICD-10-CM

## 2021-01-19 DIAGNOSIS — Z3490 Encounter for supervision of normal pregnancy, unspecified, unspecified trimester: Secondary | ICD-10-CM | POA: Diagnosis present

## 2021-01-19 LAB — CBC
HCT: 41.2 % (ref 36.0–46.0)
Hemoglobin: 13.8 g/dL (ref 12.0–15.0)
MCH: 29.8 pg (ref 26.0–34.0)
MCHC: 33.5 g/dL (ref 30.0–36.0)
MCV: 89 fL (ref 80.0–100.0)
Platelets: 166 10*3/uL (ref 150–400)
RBC: 4.63 MIL/uL (ref 3.87–5.11)
RDW: 14 % (ref 11.5–15.5)
WBC: 8.1 10*3/uL (ref 4.0–10.5)
nRBC: 0 % (ref 0.0–0.2)

## 2021-01-19 LAB — TYPE AND SCREEN
ABO/RH(D): O POS
Antibody Screen: NEGATIVE

## 2021-01-19 LAB — RESP PANEL BY RT-PCR (FLU A&B, COVID) ARPGX2
Influenza A by PCR: NEGATIVE
Influenza B by PCR: NEGATIVE
SARS Coronavirus 2 by RT PCR: NEGATIVE

## 2021-01-19 MED ORDER — LIDOCAINE HCL (PF) 1 % IJ SOLN: 30.0000 mL | INTRAMUSCULAR | Status: DC | PRN

## 2021-01-19 MED ORDER — EPHEDRINE 5 MG/ML INJ: 10.0000 mg | INTRAVENOUS | Status: DC | PRN

## 2021-01-19 MED ORDER — ACETAMINOPHEN 325 MG PO TABS: 650.0000 mg | ORAL_TABLET | ORAL | Status: DC | PRN

## 2021-01-19 MED ORDER — MISOPROSTOL 25 MCG QUARTER TABLET: 25.0000 ug | ORAL_TABLET | ORAL | Status: DC | PRN

## 2021-01-19 MED ORDER — DIPHENHYDRAMINE HCL 50 MG/ML IJ SOLN: 12.5000 mg | INTRAMUSCULAR | Status: DC | PRN

## 2021-01-19 MED ORDER — ONDANSETRON HCL 4 MG/2ML IJ SOLN: 4.0000 mg | Freq: Four times a day (QID) | INTRAMUSCULAR | Status: DC | PRN

## 2021-01-19 MED ORDER — MISOPROSTOL 50MCG HALF TABLET: 50.0000 ug | ORAL_TABLET | ORAL | Status: DC | PRN

## 2021-01-19 MED ORDER — SOD CITRATE-CITRIC ACID 500-334 MG/5ML PO SOLN: 30.0000 mL | ORAL | Status: DC | PRN

## 2021-01-19 MED ORDER — PHENYLEPHRINE 40 MCG/ML (10ML) SYRINGE FOR IV PUSH (FOR BLOOD PRESSURE SUPPORT): 80.0000 ug | PREFILLED_SYRINGE | INTRAVENOUS | Status: DC | PRN

## 2021-01-19 MED ORDER — LACTATED RINGERS IV SOLN: 500.0000 mL | INTRAVENOUS | Status: DC | PRN

## 2021-01-19 MED ORDER — FENTANYL-BUPIVACAINE-NACL 0.5-0.125-0.9 MG/250ML-% EP SOLN
12.0000 mL/h | EPIDURAL | Status: DC | PRN
  Administered 2021-01-20: 12 mL/h via EPIDURAL
  Filled 2021-01-19: qty 250

## 2021-01-19 MED ORDER — LACTATED RINGERS IV SOLN: INTRAVENOUS | Status: DC

## 2021-01-19 MED ORDER — FENTANYL CITRATE (PF) 100 MCG/2ML IJ SOLN
50.0000 ug | INTRAMUSCULAR | Status: DC | PRN
  Administered 2021-01-19: 100 ug via INTRAVENOUS
  Filled 2021-01-19: qty 2

## 2021-01-19 MED ORDER — OXYTOCIN BOLUS FROM INFUSION
333.0000 mL | Freq: Once | INTRAVENOUS | Status: AC
  Administered 2021-01-20: 333 mL via INTRAVENOUS

## 2021-01-19 MED ORDER — LACTATED RINGERS IV SOLN: 500.0000 mL | Freq: Once | INTRAVENOUS | Status: DC

## 2021-01-19 MED ORDER — MISOPROSTOL 25 MCG QUARTER TABLET
ORAL_TABLET | ORAL | Status: AC
  Filled 2021-01-19: qty 1

## 2021-01-19 MED ORDER — OXYTOCIN-SODIUM CHLORIDE 30-0.9 UT/500ML-% IV SOLN
2.5000 [IU]/h | INTRAVENOUS | Status: DC
  Administered 2021-01-20: 2.5 [IU]/h via INTRAVENOUS
  Filled 2021-01-19: qty 500

## 2021-01-19 MED ORDER — MISOPROSTOL 50MCG HALF TABLET
ORAL_TABLET | ORAL | Status: AC
  Filled 2021-01-19: qty 1

## 2021-01-19 MED ORDER — MISOPROSTOL 50MCG HALF TABLET
50.0000 ug | ORAL_TABLET | ORAL | Status: DC
  Administered 2021-01-19: 50 ug via BUCCAL

## 2021-01-19 MED ORDER — MISOPROSTOL 25 MCG QUARTER TABLET
25.0000 ug | ORAL_TABLET | ORAL | Status: DC
  Administered 2021-01-19: 25 ug via VAGINAL

## 2021-01-19 NOTE — Progress Notes (Addendum)
Molly Pratt is a 30 y.o. G3P2002 at [redacted]w[redacted]d admitted for induction of labor due to Post dates. Due date 01/13/21.  Subjective: Patient comfortable in room, beginning to feel contractions but not severely painful enough to need epidural. Discussed FB placement given patient has not made change and unable to add more cytotec, patient amenable. Spanish in-house interpretor used for the entirety of encounter.   Objective: BP 114/79   Pulse 76   Temp 98 F (36.7 C) (Oral)   Resp 16   Ht 5' 0.24" (1.53 m)   Wt 70.5 kg   LMP 04/08/2020 (Exact Date)   BMI 30.12 kg/m  No intake/output data recorded.  FHT:  FHR: 150 bpm, variability: moderate,  accelerations:  Present,  decelerations:  Absent UC:   regular, every 1.5-2 minutes  SVE:   Dilation: 1 Effacement (%): Thick Station: -3 Exam by:: lee  Foley bulb placed in lower uterine segment with ease, inflated with saline to 60cc. Confirmed placement, no bloody return. Patient tolerated procedure well.   Labs: Lab Results  Component Value Date   WBC 8.1 01/19/2021   HGB 13.8 01/19/2021   HCT 41.2 01/19/2021   MCV 89.0 01/19/2021   PLT 166 01/19/2021    Assessment / Plan: 30 y/o G3P2 at [redacted]w[redacted]d here for IOL 2/2 postterm.  Labor: Latent labor, cytotec x2 already, FB inserted with 60cc saline. Patient tolerated well. Will add pitocin if contractions begin to slow. Fetal Wellbeing:  Category I Pain Control:  Labor support without medications and Epidural upon request Pre-eclampsia: N/A I/D:  GBS Neg Anticipated MOD:  NSVD  Jen Mow, DO OB Faculty Practice 01/19/2021, 6:59 PM

## 2021-01-19 NOTE — H&P (Signed)
Molly Pratt is a 30 y.o. G2P2002 female at [redacted]w[redacted]d by LMP c/w 24wk scan presenting for IOL due to postdates.   Reports active fetal movement, contractions: none, vaginal bleeding: scant staining, membranes: intact.  Initiated prenatal care at [facility] at 13 wks.   Most recent u/s was BPP 10/10, AFI 17.5cm, cephalic at 40.1wks.   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 Abscess    in back as child,had surgery  . Family history of adverse reaction to anesthesia    low blood pressure with childbirth  . Medical history non-contributory     Past Surgical History: Past Surgical History:  Procedure Laterality Date  . ABSCESS DRAINAGE     back as a child    Obstetrical History: OB History    Gravida  3   Para  2   Term  2   Preterm  0   AB  0   Living  2     SAB  0   IAB  0   Ectopic  0   Multiple  0   Live Births  2           Social History: Social History   Socioeconomic History  . Marital status: Single    Spouse name: Not on file  . Number of children: Not on file  . Years of education: Not on file  . Highest education level: Not on file  Occupational History  . Not on file  Tobacco Use  . Smoking status: Never Smoker  . Smokeless tobacco: Never Used  Vaping Use  . Vaping Use: Never used  Substance and Sexual Activity  . Alcohol use: Not Currently    Comment: occasionally   . Drug use: Never  . Sexual activity: Yes    Birth control/protection: None  Other Topics Concern  . Not on file  Social History Narrative  . Not on file   Social Determinants of Health   Financial Resource Strain: Not on file  Food Insecurity: No Food Insecurity  . Worried About Programme researcher, broadcasting/film/video in the Last Year:  Never true  . Ran Out of Food in the Last Year: Never true  Transportation Needs: Unmet Transportation Needs  . Lack of Transportation (Medical): Yes  . Lack of Transportation (Non-Medical): Yes  Physical Activity: Not on file  Stress: Not on file  Social Connections: Not on file    Family History: Family History  Problem Relation Age of Onset  . Hypertension Mother     Allergies: No Known Allergies  Medications Prior to Admission  Medication Sig Dispense Refill Last Dose  . Prenatal Vit-Fe Fumarate-FA (PRENATAL VITAMINS PO) Take 1 tablet by mouth daily.       Review of Systems  Pertinent pos/neg as indicated in HPI  Height 5' 0.24" (1.53 m), weight 70.5 kg, last menstrual period 04/08/2020. General appearance: alert, cooperative and no distress Lungs: clear to auscultation bilaterally Heart: regular rate and rhythm Abdomen: gravid, soft, non-tender, EFW by Leopold's approximately 7lbs Extremities: tr edema DTR's nl  Fetal monitoring: FHR: 140s bpm, variability: moderate,  Accelerations: Present,  decelerations:  Absent Uterine activity: irreg, mild Dilation: 1 Effacement (%): Thick Exam by:: Caitlynne Harbeck,cnm Presentation: cephalic   Prenatal labs: ABO, Rh: O/Positive/-- (11/08  0300) Antibody: Negative (11/08 0913) Rubella: 7.54 (11/08 0913) RPR: Non Reactive (02/17 0850)  HBsAg: Negative (11/08 0913)  HIV: Non Reactive (02/17 0850)  GBS: Negative/-- (04/20 1109)  2hr GTT: 77/121/107  Prenatal Transfer Tool  Maternal Diabetes: No Genetic Screening: Abnormal:  Results: Other:low risk NIPS; intermediate allele size detected in Fragile X syndrome on carrier screening>no increased Fragile X risk Maternal Ultrasounds/Referrals: Normal Fetal Ultrasounds or other Referrals:  Referred to Materal Fetal Medicine (due to carrier screening) Maternal Substance Abuse:  No Significant Maternal Medications:  None Significant Maternal Lab Results: Group B Strep negative  No  results found for this or any previous visit (from the past 24 hour(s)).   Assessment:  [redacted]w[redacted]d SIUP  G3P2002  Cx unfavorable  Cat 1 FHR  GBS Negative/-- (04/20 1109)  Plan:  Admit to L&D  IV pain meds/epidural prn active labor  Plan cervical ripening with cytotec and then possibly a repeat dose  vs Pitocin  Anticipate vag del   Plans to breastfeed & bottlefeed  Contraception: unsure   Arabella Merles CNM 01/19/2021, 10:55 AM

## 2021-01-20 ENCOUNTER — Encounter (HOSPITAL_COMMUNITY): Payer: Self-pay | Admitting: Obstetrics & Gynecology

## 2021-01-20 ENCOUNTER — Inpatient Hospital Stay (HOSPITAL_COMMUNITY): Payer: Medicaid Other | Admitting: Anesthesiology

## 2021-01-20 ENCOUNTER — Other Ambulatory Visit: Payer: Medicaid Other

## 2021-01-20 DIAGNOSIS — O480 Post-term pregnancy: Secondary | ICD-10-CM

## 2021-01-20 DIAGNOSIS — Z3A40 40 weeks gestation of pregnancy: Secondary | ICD-10-CM

## 2021-01-20 LAB — RPR: RPR Ser Ql: NONREACTIVE

## 2021-01-20 MED ORDER — DIBUCAINE (PERIANAL) 1 % EX OINT
1.0000 "application " | TOPICAL_OINTMENT | CUTANEOUS | Status: DC | PRN
  Administered 2021-01-21: 1 via RECTAL
  Filled 2021-01-20: qty 28

## 2021-01-20 MED ORDER — SIMETHICONE 80 MG PO CHEW: 80.0000 mg | CHEWABLE_TABLET | ORAL | Status: DC | PRN

## 2021-01-20 MED ORDER — WITCH HAZEL-GLYCERIN EX PADS: 1.0000 "application " | MEDICATED_PAD | CUTANEOUS | Status: DC | PRN

## 2021-01-20 MED ORDER — BENZOCAINE-MENTHOL 20-0.5 % EX AERO
1.0000 "application " | INHALATION_SPRAY | CUTANEOUS | Status: DC | PRN
  Administered 2021-01-20: 1 via TOPICAL
  Filled 2021-01-20: qty 56

## 2021-01-20 MED ORDER — SENNOSIDES-DOCUSATE SODIUM 8.6-50 MG PO TABS
2.0000 | ORAL_TABLET | Freq: Every day | ORAL | Status: DC
  Administered 2021-01-21: 2 via ORAL
  Filled 2021-01-20: qty 2

## 2021-01-20 MED ORDER — IBUPROFEN 600 MG PO TABS
600.0000 mg | ORAL_TABLET | Freq: Four times a day (QID) | ORAL | Status: DC
  Administered 2021-01-20 – 2021-01-21 (×4): 600 mg via ORAL
  Filled 2021-01-20 (×4): qty 1

## 2021-01-20 MED ORDER — TETANUS-DIPHTH-ACELL PERTUSSIS 5-2.5-18.5 LF-MCG/0.5 IM SUSY: 0.5000 mL | PREFILLED_SYRINGE | Freq: Once | INTRAMUSCULAR | Status: DC

## 2021-01-20 MED ORDER — LIDOCAINE HCL (PF) 1 % IJ SOLN
INTRAMUSCULAR | Status: DC | PRN
  Administered 2021-01-20: 8 mL via EPIDURAL

## 2021-01-20 MED ORDER — DIPHENHYDRAMINE HCL 50 MG/ML IJ SOLN
12.5000 mg | INTRAMUSCULAR | Status: DC | PRN
Start: 2021-01-20 — End: 2021-01-20

## 2021-01-20 MED ORDER — FENTANYL-BUPIVACAINE-NACL 0.5-0.125-0.9 MG/250ML-% EP SOLN
12.0000 mL/h | EPIDURAL | Status: DC | PRN
Start: 2021-01-20 — End: 2021-01-20

## 2021-01-20 MED ORDER — ONDANSETRON HCL 4 MG/2ML IJ SOLN: 4.0000 mg | INTRAMUSCULAR | Status: DC | PRN

## 2021-01-20 MED ORDER — ACETAMINOPHEN 325 MG PO TABS: 650.0000 mg | ORAL_TABLET | ORAL | Status: DC | PRN

## 2021-01-20 MED ORDER — COCONUT OIL OIL: 1.0000 "application " | TOPICAL_OIL | Status: DC | PRN

## 2021-01-20 MED ORDER — ONDANSETRON HCL 4 MG PO TABS: 4.0000 mg | ORAL_TABLET | ORAL | Status: DC | PRN

## 2021-01-20 MED ORDER — PRENATAL MULTIVITAMIN CH
1.0000 | ORAL_TABLET | Freq: Every day | ORAL | Status: DC
  Administered 2021-01-20: 1 via ORAL
  Filled 2021-01-20: qty 1

## 2021-01-20 MED ORDER — DIPHENHYDRAMINE HCL 25 MG PO CAPS: 25.0000 mg | ORAL_CAPSULE | Freq: Four times a day (QID) | ORAL | Status: DC | PRN

## 2021-01-20 NOTE — Discharge Summary (Addendum)
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 yo [id number] admitted to the hospital 01/19/2021 for induction of labor.  Indication for induction: Postdates.  Patient had an uncomplicated labor course as follows: Membrane Rupture Time/Date: 12:29 AM ,01/20/2021   Delivery Method:Vaginal, Spontaneous  Episiotomy: None  Lacerations:  Labial  Details of delivery can be found in separate delivery note.  Patient had a routine postpartum course. Patient is discharged home 01/21/21.  Newborn Data: Birth date:01/20/2021  Birth time:4:35 AM  Gender:Female  Living status:Living  Apgars:9 ,9  Weight:3776 g (8lb 5.2oz)  Magnesium Sulfate received: No BMZ received: No Rhophylac:No MMR:No T-DaP:Given prenatally Flu: Yes Transfusion:No  Physical exam  Vitals:   01/20/21 1110 01/20/21 1506 01/20/21 1935 01/21/21 0510  BP: 98/71 109/82 109/80 114/81  Pulse: 74 80 80 82  Resp: _0 Temp: 98.7 F (37.1 C) 98.5 F (36.9 C) 98.6 F (37 C) 98.5 F (36.9 C)  TempSrc: Oral Oral Oral Oral  SpO2: 99% 99% 100% 99%  Weight:      Height:       General: alert, cooperative and no distress Lochia: appropriate Uterine Fundus: firm Incision: N/A DVT Evaluation: trace bilateral ankle  edema Labs: Lab Results  Component Value Date   WBC 8.1 01/19/2021   HGB 13.8 01/19/2021   HCT 41.2 01/19/2021   MCV 89.0 01/19/2021   PLT 166 01/19/2021   No flowsheet data found. Edinburgh Score: Edinburgh Postnatal Depression Scale Screening Tool 01/20/2021  I have been able to laugh and see the funny side of things. 0  I have looked forward with enjoyment to things. 0  I have blamed myself unnecessarily when things went wrong. 0  I have been anxious or worried for no good reason. 0  I have felt scared or panicky for no good reason. 0  Things have been getting on top of me. 1  I have been so unhappy that I have had difficulty sleeping. 0  I have felt sad or miserable. 0  I have been so unhappy that I have been crying. 0  The thought of harming myself has occurred to me. 0  Edinburgh Postnatal Depression Scale Total 1     After visit meds:  Allergies as of 01/21/2021   No Known Allergies     Medication List    TAKE these medications   acetaminophen 325 MG tablet Commonly known as: Tylenol Take 2 tablets (650 mg total) by mouth every 4 (four) hours as needed (for pain scale < 4).   coconut oil Oil Apply 1 application topically as needed.   dibucaine 1 %  Oint Commonly known as: NUPERCAINAL Place 1 application rectally as needed for hemorrhoids.   ibuprofen 600 MG tablet Commonly known as: ADVIL Take 1 tablet (600 mg total) by mouth every 6 (six) hours.   PRENATAL VITAMINS PO Take 1 tablet by mouth daily.        Discharge home in stable condition Infant Feeding: Bottle and Breast Infant Disposition:home with mother Discharge instruction: per After Visit Summary and Postpartum booklet. Activity: Advance as tolerated. Pelvic rest for 6 weeks.  Diet: routine diet Future Appointments:No future appointments. Follow up Visit:   Please schedule this patient for a In person postpartum visit in 6 weeks with the following provider: Any provider. Additional  Postpartum F/U:n/a  Low risk pregnancy complicated by: carrier of intermediate allele size for Fragile X syndrome, language barrier Delivery mode:  Vaginal, Spontaneous  Anticipated Birth Control:  Unsure, considering IUD   01/21/2021 Molly Button MD)  CNM attestation I have seen and examined this patient and agree with above documentation in the resident's note.   Molly Pratt is a 30 y.o. [phone] s/p vag del.   Pain is well controlled.  Plan for birth control is IUD.  Method of Feeding: both  PE:  BP 114/81 (BP Location: Left Arm)   Pulse 82   Temp 98.5 F (36.9 C) (Oral)   Resp 16   Ht 5' 0.24" (1.53 m)   Wt 70.5 kg   LMP 04/08/2020 (Exact Date)   SpO2 99%   Breastfeeding Unknown   BMI 30.12 kg/m  Fundus firm  Recent Labs    01/19/21 1019  HGB 13.8  HCT 41.2     Plan: discharge today - postpartum care discussed - f/u clinic in 4 weeks for postpartum visit   Molly Pratt, CNM 9:44 AM  01/21/2021

## 2021-01-20 NOTE — Anesthesia Procedure Notes (Signed)
Epidural Patient location during procedure: OB Start time: 01/20/2021 1:20 AM End time: 01/20/2021 1:25 AM  Staffing Anesthesiologist: Bethena Midget, MD  Preanesthetic Checklist Completed: patient identified, IV checked, site marked, risks and benefits discussed, surgical consent, monitors and equipment checked, pre-op evaluation and timeout performed  Epidural Patient position: sitting Prep: DuraPrep and site prepped and draped Patient monitoring: continuous pulse ox and blood pressure Approach: midline Location: L4-L5 Injection technique: LOR air  Needle:  Needle type: Tuohy  Needle gauge: 17 G Needle length: 9 cm and 9 Needle insertion depth: 6 cm Catheter type: closed end flexible Catheter size: 19 Gauge Catheter at skin depth: 11 cm Test dose: negative  Assessment Events: blood not aspirated, injection not painful, no injection resistance, no paresthesia and negative IV test

## 2021-01-20 NOTE — Progress Notes (Signed)
Molly Pratt is a 30 y.o. G3P2002 at [redacted]w[redacted]d admitted for induction of labor due to Post dates. Due date 01/13/21.  Subjective: Molly Pratt is comfortable with epidural.   Objective: BP 100/68   Pulse 89   Temp 98.3 F (36.8 C) (Oral)   Resp 16   Ht 5' 0.24" (1.53 m)   Wt 70.5 kg   LMP 04/08/2020 (Exact Date)   SpO2 98%   BMI 30.12 kg/m  No intake/output data recorded. No intake/output data recorded.  FHT: 135 bpm, moderate variability, +accels, no decels UC: Q2 mins SVE:   Dilation: 9 Effacement (%): 100 Station: -1 Exam by:: Lysle Dingwall, CNM  Labs: Lab Results  Component Value Date   WBC 8.1 01/19/2021   HGB 13.8 01/19/2021   HCT 41.2 01/19/2021   MCV 89.0 01/19/2021   PLT 166 01/19/2021    Assessment / Plan: Molly Pratt 30 y.o. [id number]  Labor: s/p foley balloon and cytotec. Contracting regularly Q2 mins. SROM at 0029, meconium stained.  Fetal Wellbeing:  Category I Pain Control:  Epidural I/D:  GBS negative Anticipated MOD:  NSVD  Brand Males, CNM 01/20/2021, 2:45 AM

## 2021-01-20 NOTE — Progress Notes (Signed)
Molly Pratt is a 30 y.o. G3P2002 at [redacted]w[redacted]d admitted for induction of labor due to Post dates. Due date 01/13/21.  Subjective: Molly Pratt is reporting very painful contractions that are very close together. She is requesting an epidural at this time.  Objective: BP 116/76   Pulse 75   Temp 98.3 F (36.8 C) (Oral)   Resp 16   Ht 5' 0.24" (1.53 m)   Wt 70.5 kg   LMP 04/08/2020 (Exact Date)   BMI 30.12 kg/m  No intake/output data recorded. No intake/output data recorded.  FHT: 135 bpm, moderate variability, +accels, no decels UC: Q2 mins SVE:   Dilation: 3 Effacement (%): 50 Station: -2 Exam by:: Molly Rinks, RN  Labs: Lab Results  Component Value Date   WBC 8.1 01/19/2021   HGB 13.8 01/19/2021   HCT 41.2 01/19/2021   MCV 89.0 01/19/2021   PLT 166 01/19/2021    Assessment / Plan: Molly Pratt 30 y.o. [id number]  Labor: s/p foley balloon and cytotec. Contracting regularly Q2 mins. SROM at 0029, meconium stained.  Fetal Wellbeing:  Category I Pain Control:  Epidural I/D:  GBS negative Anticipated MOD:  NSVD  Molly Pratt, CNM 01/20/2021, 1:10 AM

## 2021-01-20 NOTE — Anesthesia Preprocedure Evaluation (Signed)
Anesthesia Evaluation  Patient identified by MRN, date of birth, ID band Patient awake    Reviewed: Allergy & Precautions, H&P , NPO status , Patient's Chart, lab work & pertinent test results, reviewed documented beta blocker date and time   Airway Mallampati: II  TM Distance: >3 FB Neck ROM: full    Dental no notable dental hx. (+) Teeth Intact, Dental Advisory Given   Pulmonary neg pulmonary ROS,    Pulmonary exam normal breath sounds clear to auscultation       Cardiovascular negative cardio ROS Normal cardiovascular exam Rhythm:regular Rate:Normal     Neuro/Psych negative neurological ROS  negative psych ROS   GI/Hepatic negative GI ROS, Neg liver ROS,   Endo/Other  negative endocrine ROS  Renal/GU negative Renal ROS  negative genitourinary   Musculoskeletal   Abdominal   Peds  Hematology negative hematology ROS (+)   Anesthesia Other Findings   Reproductive/Obstetrics (+) Pregnancy                             Anesthesia Physical Anesthesia Plan  ASA: II  Anesthesia Plan: Epidural   Post-op Pain Management:    Induction:   PONV Risk Score and Plan: 2  Airway Management Planned: Natural Airway  Additional Equipment: None  Intra-op Plan:   Post-operative Plan:   Informed Consent: I have reviewed the patients History and Physical, chart, labs and discussed the procedure including the risks, benefits and alternatives for the proposed anesthesia with the patient or authorized representative who has indicated his/her understanding and acceptance.     Dental Advisory Given  Plan Discussed with: Anesthesiologist and CRNA  Anesthesia Plan Comments: (Labs checked- platelets confirmed with RN in room. Fetal heart tracing, per RN, reported to be stable enough for sitting procedure. Discussed epidural, and patient consents to the procedure:  included risk of possible  headache,backache, failed block, allergic reaction, and nerve injury. This patient was asked if she had any questions or concerns before the procedure started.)        Anesthesia Quick Evaluation

## 2021-01-20 NOTE — Anesthesia Postprocedure Evaluation (Signed)
Anesthesia Post Note  Patient: Molly Pratt  Procedure(s) Performed: AN AD HOC LABOR EPIDURAL     Patient location during evaluation: Mother Baby Anesthesia Type: Epidural Level of consciousness: awake and alert Pain management: pain level controlled Vital Signs Assessment: post-procedure vital signs reviewed and stable Respiratory status: spontaneous breathing, nonlabored ventilation and respiratory function stable Cardiovascular status: stable Postop Assessment: no headache, no backache, epidural receding, no apparent nausea or vomiting, patient able to bend at knees, adequate PO intake and able to ambulate Anesthetic complications: no   No complications documented.  Last Vitals:  Vitals:   01/20/21 0743 01/20/21 1110  BP: 109/77 98/71  Pulse: 85 74  Resp: 16 16  Temp: 37.1 C 37.1 C  SpO2: 98% 99%    Last Pain:  Vitals:   01/20/21 1111  TempSrc:   PainSc: 1    Pain Goal:                   [employer]

## 2021-01-21 ENCOUNTER — Inpatient Hospital Stay (HOSPITAL_COMMUNITY): Payer: Medicaid Other

## 2021-01-21 MED ORDER — DIBUCAINE (PERIANAL) 1 % EX OINT: 1.0000 "application " | TOPICAL_OINTMENT | CUTANEOUS | 1 refills | Status: AC | PRN

## 2021-01-21 MED ORDER — IBUPROFEN 600 MG PO TABS: 600.0000 mg | ORAL_TABLET | Freq: Four times a day (QID) | ORAL | 0 refills | Status: AC

## 2021-01-21 MED ORDER — ACETAMINOPHEN 325 MG PO TABS: 650.0000 mg | ORAL_TABLET | ORAL | Status: AC | PRN

## 2021-01-21 MED ORDER — COCONUT OIL OIL: 1.0000 "application " | TOPICAL_OIL | 0 refills | Status: AC | PRN

## 2021-01-21 NOTE — Discharge Instructions (Signed)
Cuidados ps-parto normal Postpartum Care After Vaginal Delivery As informaes a seguir fornecem [employer] cuidar de si, desde o momento do parto at [employer] (perodo ps-parto). Caso tenha problemas ou perguntas, entre em contato com seu mdico para orientaes mais especficas. Siga estas instrues em casa: Sangramento vaginal   normal ter sangramento vaginal (lquios) aps o parto. Use um absorvente externo para sangramento e secreo vaginal. ? Durante a primeira semana aps o parto, a quantidade e a aparncia dos lquios costumam ser semelhantes a um perodo menstrual. ? Nas semanas seguintes, ele diminuir gradualmente para uma secreo amarelada e seca. ? Na [employer], os lquios cessam completamente at 4-6 semanas aps o parto, mas isso pode variar.  Troque seus absorventes com frequncia. Fique atento a qualquer alterao no seu fluxo, como: ? Um aumento repentino no volume. ? Mudana na cor. ? Grandes cogulos sanguneos.  Se voc expelir um cogulo sanguneo pela vagina, guarde-o e ligue para seu mdico. No elimine os cogulos no vaso sanitrio sem falar com seu mdico antes.  No use absorventes internos nem faa duchas vaginais at que seu mdico libere.  Caso no esteja amamentando, sua menstruao dever retornar 6-8 semanas aps o parto. Caso esteja amamentando o beb exclusivamente no peito, seu ciclo menstrual pode no retornar at [employer]. Cuidados com a rea perineal  Mantenha a rea entre a vagina e o nus (perneo) limpa e seca. Use absorventes com medicamentos e sprays e cremes para aliviar a dor conforme orientado.  Se um rasgo ou corte cirrgico no perneo (episiotomia) tiver [employer] voc, verifique se h sinais de infeco na rea at [employer]. Verifique a ocorrncia de: ? Aumento da vermelhido, do Film/video editor. ? Lquido ou sangue saindo do corte ou rasgo. ? Calor. ? Pus ou  mau cheiro.  Voc pode receber uma garrafa de esguicho para usar em vez de passar papel higinico para limpar a rea do perneo depois de usar o banheiro. Seque a rea delicadamente e sem esfregar.  Para aliviar a dor causada por episiotomia, um rasgo ou veias inchadas no nus (hemorroidas), tome um banho de assento morno 2-3 vezes ao dia. Em um banho de assento, a gua morna deve chegar somente at os quadris e cobrir os glteos.   Cuidados com os seios  Nos primeiros dias aps o parto, suas mamas podem ficar pesadas, cheias e desconfortveis (ingurgitao mamria). Tambm pode vazar leite de suas mamas. Pergunte ao seu mdico sobre formas de Paramedic o desconforto.  Caso esteja amamentando: ? Use um suti que oferea apoio s mamas e seja do tamanho adequado. Use absorventes para seios para absorver o Conservation officer, nature. ? Mantenha os mamilos limpos e secos. Aplique cremes e pomadas conforme orientado. ? Voc pode ter contraes uterinas toda vez que amamentar durante vrias semanas aps o parto. Isso ajuda o tero a voltar ao tamanho normal. ? Se voc tiver algum problema com a amamentao, avise seu mdico ou consultor de Advertising account planner.  Caso no esteja amamentando: ? Evite tocar nas mamas. No extraia leite (bombear). Isso pode fazer com que seus seios produzam mais leite. ? Use um suti bem ajustado e compressas frias para ajudar no inchao. Intimidade e sexualidade  Pergunte ao seu mdico sobre quando voc poder ter relaes sexuais. Isso poder depender de: ? Seu risco de infeces. ? Se voc est cicatrizando rpido. ? Seu conforto e desejo de realizar atividade sexual.  Voc pode engravidar  aps o parto mesmo se no tiver menstruado. Converse com seu mdico sobre mtodos de controle de natalidade (contracepo) ou planejamento familiar caso tenha inteno de engravidar novamente. Medicamentos  Tome medicamentos vendidos com ou sem receita mdica somente de acordo com as indicaes do  seu mdico.  Tome um emoliente fecal de venda livre para ajudar na evacuao de acordo com as indicaes do seu mdico.  Caso tenha recebido uma prescrio de antibitico, tome-o conforme a orientao do seu mdico. No pare de tomar o antibitico mesmo se comear a se Passenger transport manager.  Confira todas as prescries antigas e atuais para verificar a possvel transferncia para o leite materno. Atividades  Retorne gradualmente s suas atividades normais de acordo com as orientaes do seu mdico.  Repouse o mximo possvel. Tire uma soneca enquanto seu beb estiver dormindo. Alimentos e bebidas  Beba lquidos em quantidade suficiente para Armed forces training and education officer a urina na cor amarelo-plida.  Para prevenir ou aliviar a priso de ventre, coma alimentos ricos em fibras todos os dias.  Faa escolhas alimentares saudveis para ajudar na [address] ou nas metas de perda de [place].  Tome as vitaminas pr-natais at [employer] para parar.   Dicas/recomendaes gerais  No use produtos que contenham nicotina ou tabaco. Esses produtos incluem cigarros tradicionais, fumo de mascar e cigarros eletrnicos. Caso precise de ajuda para parar de fumar, fale com seu mdico.  No consuma lcool, especialmente se estiver amamentando.  No tome medicamentos que no tenham sido prescritos para voc, principalmente se estiver amamentando.  Passe com seu mdico para um check-up ps-parto nas primeiras 3-6 semanas aps o parto.  Marque uma consulta ps-parto completa no mximo 12 semanas aps o parto.  Comparea a todas as consultas de acompanhamento suas e do seu beb. Entre em contato com um mdico se:  Voc se sentir incomumente triste ou preocupada.  Suas mamas ficarem vermelhas, doloridas ou duras.  Tiver febre ou outros sinais de infeco.  Houver sangramento que encharque um absorvente por hora ou se tiver cogulos sanguneos.  Tiver alteraes na viso ou uma dor de cabea intensa e que no  passa.  Tiver enjoo ou vmito e no conseguir comer ou beber nada por 24 horas. Busque ajuda imediatamente se:  Sentir dor no peito ou dificuldade para respirar.  Tiver dor repentina e intensa nas pernas.  Desmaiar ou sofrer uma convulso.  Tiver pensamentos de Clinical cytogeneticist a si mesma ou ao seu beb. Se sentir vontade de se Clinical cytogeneticist ou de ferir [employer] ou pensar em tirar a prpria vida, procure ajuda imediatamente. V para o pronto-socorro mais prximo ou:  Ligue para o nmero de Technical brewer (911, nos EUA).  National Suicide Prevention Lifeline (Linha da Vida de Preveno ao [employer]) no telefone [phone]. Essa linha direta de ajuda na crise suicida funciona 24 horas por dia.  Envie uma mensagem de texto para o servio de preveno de crises em [phone] (nos EUA). Resumo  O perodo aps o nascimento do recm-nascido at 6-12 semanas aps o parto  chamado de perodo ps-parto.  Comparea a todas as consultas de acompanhamento suas e do seu beb.  Confira todas as prescries antigas e atuais para verificar a possvel transferncia para o leite materno.  Entre em contato com um mdico se voc se sentir mais triste ou preocupada que o normal no perodo ps-parto. Estas informaes no se destinam a substituir as recomendaes de seu mdico. No deixe de discutir quaisquer dvidas com seu mdico. Document Revised: 06/17/2020 Document Reviewed:  06/17/2020 Elsevier Patient Education  2021 [employer].

## 2021-03-04 ENCOUNTER — Other Ambulatory Visit (HOSPITAL_COMMUNITY)
Admission: RE | Admit: 2021-03-04 | Discharge: 2021-03-04 | Disposition: A | Discharge status: Home / Self Care | Payer: Medicaid Other | Source: Ambulatory Visit | Attending: Women's Health | Admitting: Women's Health

## 2021-03-04 ENCOUNTER — Other Ambulatory Visit: Payer: Self-pay

## 2021-03-04 ENCOUNTER — Ambulatory Visit (INDEPENDENT_AMBULATORY_CARE_PROVIDER_SITE_OTHER): Payer: Medicaid Other | Admitting: Women's Health

## 2021-03-04 DIAGNOSIS — Z124 Encounter for screening for malignant neoplasm of cervix: Secondary | ICD-10-CM | POA: Insufficient documentation

## 2021-03-04 DIAGNOSIS — N898 Other specified noninflammatory disorders of vagina: Secondary | ICD-10-CM | POA: Insufficient documentation

## 2021-03-04 NOTE — Patient Instructions (Signed)
AREA FAMILY PRACTICE PHYSICIANS  [place] ([address]) [facility] [address] [place], [place] [address] [phone] Mon-Fri 8:30-12:30, 1:30-5:00 Accepting Medicaid [facility] [address], [place], [place] [address] [phone] Mon-Fri 8:00-5:30 [facility] [address] [place], [place] [address] [phone] Mon, Tue, Thur, Fri 8:30-5:00, Wed 10:00-7:00 (closed 1-2pm) Accepting Medicaid [facility] [address], [place], [place]  [address] Phone - [phone]   Fax - [phone]  [place] ([address]) [facility] [address] [place], [place] [address] [phone] Mon-Fri 8:00-5:00 [facility] ([facility])  [address] [place], [place] [address] [phone] Mon-Fri 8:30-5:30, Sat (Oct.-Mar.) 9:00-1:00 Accepting Medicaid  [place] ([address]) [facility] [address], [place], [place] [address] [phone] Mon-Fri 8:00-5:00  [place] ([address]) [facility] [address], [place], [place] [address] [phone] Mon-Fri 8:00-5:00 [facility] [address], [place], [place] [address] [phone] Mon-Fri 8:00-5:00 [facility] [address] [place], [place] [address] [phone] Mon-Fri 8:00-5:00 [facility] [address] [place] [address] [phone] Mon-Fri 7:30-5:30  [place] ([address] & [address]) [facility] [address] [place], [place] [address] [phone] Mon-Thur 8:00-6:00 Accepting Medicaid [facility] [address] [place], [place] [address] [phone] Mon-Thur 7:30-7:30, Fri 7:30-4:30 Accepting  Medicaid [facility] [address], [place], [place]  [address] [phone]   Fax - [phone]  [place] ([address] & [address]) [facility] [address] [place], [place] [address] [phone] Mon-Fri 7:00-5:00 [facility] [address], [place], [place] [address] [phone] Mon-Fri 8:00-5:00 Accepting Medicaid [facility] [address], [place], [place] [address] [phone] Mon-Fri 8:00-5:00 Accepting Medicaid  [place] ([address]) [facility] [place] [address] [place], [place] [address] [phone] Mon-Fri 8:00-5:00 [facility] [address], [place], [place] [address] [phone] Mon-Fri 8:00-5:00 Accepting Medicaid [facility] ([facility]) [address], [place], [place] [address] [phone] Mon-Fri 8:00-5:30, Sat&Sun by appointment (phones open at 8:30) Accepting Medicaid  [place] ([address]) [facility] [address] [place], [place] [address] [phone] Mon-Thur 8:00-7:00, Fri 8:00-5:00, Sat 8:00-12:00, Sun 9:00-12:00 Accepting Medicaid [facility] [place] [address], [place], [place] [address] [phone] Mon-Thur 8:00-5:00 Accepting Medicaid [facility] [address] [place], [place] [address] [phone] Mon-Fri 8:00-5:30, Sat (Oct.-Mar.) 9:00-1:00 Accepting Medicaid  [place] ([address]) [facility] [address], [place], [place] [address] [phone] Mon-Fri 8:00-5:00 Accepting Medicaid   [place] ([address]) [facility] [place] [address], [place], [place] [address] [phone] Mon-Fri 8:00-5:00  [facility] [place] [address], [place], [place] [address] [phone] Mon-Fri 8:00-5:00 [facility] [place] [address] [place] [address], [place], [place] [address] [phone] Mon-Fri 8:00-5:00 After hours clinic ([address] [place],  [address]) [phone] Mon-Fri 5:00-8:00, Sat 12:00-6:00, Sun 10:00-4:00 Accepting Medicaid [facility] [place]   [address], [place], [place]  [address] [phone]   Fax - [phone]  [place] ([address]) [facility] [place] [facility] [address], [place], [place] [address] [phone] Mon-Fri 8:00-5:00 [facility] [place] ([facility] [place]) [address], [place], [place] [address] [phone] Mon-Thur 8:00-7:00, Fri 8:00-5:00, Sat 8:00-12:00    

## 2021-03-04 NOTE — Progress Notes (Signed)
Post Partum Visit Note  Molly Pratt is a 30 y.o. [id number] female who presents for a postpartum visit. She is 6 weeks postpartum following a normal spontaneous vaginal delivery.  I have fully reviewed the prenatal and intrapartum course. The delivery was at 41 gestational weeks.  Anesthesia: epidural. Postpartum course has been normal. Baby is doing well. Baby is feeding by both breast and bottle - Enfamil . Bleeding no bleeding. Patient states she is having a clear, thin, discharge that has an odor to it. Bowel function is normal. Bladder function is normal. Patient is not sexually active. Contraception method is condoms. Postpartum depression screening: negative.   The pregnancy intention screening data noted above was reviewed. Potential methods of contraception were discussed. The patient elected to proceed with Female Condom and husband is getting a vasectomy in the near future. Patient reports will continue to use condoms until that time and states that she is comfortable with using condoms. Patient declines IUD/Depo or other forms of contraception today.   Edinburgh Postnatal Depression Scale - 03/04/21 0911       Edinburgh Postnatal Depression Scale:  In the Past 7 Days   I have been able to laugh and see the funny side of things. 0    I have looked forward with enjoyment to things. 0    I have blamed myself unnecessarily when things went wrong. 1    I have been anxious or worried for no good reason. 0    I have felt scared or panicky for no good reason. 0    Things have been getting on top of me. 0    I have been so unhappy that I have had difficulty sleeping. 0    I have felt sad or miserable. 0    I have been so unhappy that I have been crying. 0    The thought of harming myself has occurred to me. 0    Edinburgh Postnatal Depression Scale Total 1             Health Maintenance Due  Topic Date Due   COVID-19 Vaccine (3 - Booster for Pfizer series) 09/28/2020     The following portions of the patient's history were reviewed and updated as appropriate: allergies, current medications, past family history, past medical history, past social history, past surgical history, and problem list.  Review of Systems Pertinent items noted in HPI and remainder of comprehensive ROS otherwise negative.  Objective:  BP 102/79   Pulse 85   Wt 141 lb 12.8 oz (64.3 kg)   BMI 27.48 kg/m    General:  alert, cooperative, and no distress   Breasts:  not indicated  Lungs: clear to auscultation bilaterally  Heart:  regular rate and rhythm, S1, S2 normal, no murmur, click, rub or gallop  Abdomen: soft, non-tender; bowel sounds normal; no masses,  no organomegaly   Wound well approximated incision  GU exam:  normal       Assessment:   1. Postpartum exam  2. Vaginal discharge - pt reports white vaginal discharge with odor - no abnormalities noted on exam - Cervicovaginal ancillary only( [name])  3. Screening for cervical cancer - Cytology - PAP( [name])  Normal postpartum exam.   Plan:   Essential components of care per ACOG recommendations:  1.  Mood and well being: Patient with negative depression screening today. Reviewed local resources for support.  - Patient tobacco use? No.   - hx of  drug use? No.    2. Infant care and feeding:  -Patient currently breastmilk feeding? Yes. Discussed returning to work and pumping. Reviewed importance of draining breast regularly to support lactation.  -Social determinants of health (SDOH) reviewed in EPIC.  3. Sexuality, contraception and birth spacing - Patient does not want a pregnancy in the next year.  Desired family size is 3 children.  - Reviewed forms of contraception in tiered fashion. Patient desired condoms, vasectomy today.   - Discussed birth spacing of 18 months  4. Sleep and fatigue -Encouraged family/partner/community support of 4 hrs of uninterrupted sleep to help with mood and  fatigue  5. Physical Recovery  - Discussed patients delivery and complications. She describes her labor as good. - Patient had a Vaginal problems after delivery including laceration . Patient had a  labial  laceration. Perineal healing reviewed. Patient expressed understanding - Patient has urinary incontinence? No. - Patient is safe to resume physical and sexual activity  6.  Health Maintenance - HM due items addressed Yes - Last pap smear No results found for: DIAGPAP Pap smear done at today's visit.  -Breast Cancer screening indicated? No.   7. Chronic Disease/Pregnancy Condition follow up: None  - PCP follow up, list given, pt reports she has an appt in [facility] for [employer], [facility]

## 2021-03-05 LAB — CERVICOVAGINAL ANCILLARY ONLY
Bacterial Vaginitis (gardnerella): POSITIVE — AB
Candida Glabrata: NEGATIVE
Candida Vaginitis: NEGATIVE
Chlamydia: NEGATIVE
Comment: NEGATIVE
Comment: NEGATIVE
Comment: NEGATIVE
Comment: NEGATIVE
Comment: NEGATIVE
Comment: NORMAL
Neisseria Gonorrhea: NEGATIVE
Trichomonas: NEGATIVE

## 2021-03-07 NOTE — Progress Notes (Signed)
Good morning,  Please call patient to alert to positive BV results and treat per protocol. Patient reported discharge with odor at recent visit. Patient does not have access to MyChart and is Spanish-speaking.  Thank you, Joni Reining

## 2021-03-08 ENCOUNTER — Telehealth: Payer: Self-pay

## 2021-03-08 DIAGNOSIS — B9689 Other specified bacterial agents as the cause of diseases classified elsewhere: Secondary | ICD-10-CM

## 2021-03-08 MED ORDER — METRONIDAZOLE 500 MG PO TABS: 500.0000 mg | ORAL_TABLET | Freq: Two times a day (BID) | ORAL | 0 refills | Status: AC

## 2021-03-08 NOTE — Telephone Encounter (Addendum)
-----   Message from Marylen Ponto, NP sent at 03/07/2021  8:55 PM EDT ----- Good morning,  Please call patient to alert to positive BV results and treat per protocol. Patient reported discharge with odor at recent visit. Patient does not have access to MyChart and is Spanish-speaking.  Thank you, Joni Reining  ----------------------------------  Called pt with interpreter Raquel. Results given and treatment sent to patient's preferred pharmacy per protocol. Pt advised to take with food to avoid stomach upset and to avoid alcohol while taking.

## 2021-03-11 LAB — CYTOLOGY - PAP
Comment: NEGATIVE
Comment: NEGATIVE
Diagnosis: UNDETERMINED — AB
HPV 16: NEGATIVE
HPV 18 / 45: NEGATIVE
High risk HPV: POSITIVE — AB

## 2021-03-18 ENCOUNTER — Telehealth: Payer: Self-pay

## 2021-03-18 NOTE — Telephone Encounter (Signed)
Spoke with pt using Eda R-interpreter. Pt given results and recommendations per Donia Ast, NP. Pt verbalized understanding and agreeable to plan of care. Pt is scheduled with Dr Shawnie Pons on 8/15 at 1035am. Pt agreeable to date and time of appt.   Judeth Cornfield, RN

## 2021-03-18 NOTE — Progress Notes (Signed)
Pt with ASCUS/+HPV Pap, needs colpo. Can be scheduled with me. Please call patient to alert to results and let her know she needs to be scheduled as soon as she is able. Patient will need Spanish interpreter. Thank you, Joni Reining

## 2021-03-18 NOTE — Telephone Encounter (Signed)
-----   Message from Marylen Ponto, NP sent at 03/18/2021  2:40 PM EDT ----- Pt with ASCUS/+HPV Pap, needs colpo. Can be scheduled with me. Please call patient to alert to results and let her know she needs to be scheduled as soon as she is able. Patient will need Spanish interpreter. Thank you, Joni Reining

## 2021-04-12 ENCOUNTER — Ambulatory Visit: Payer: Medicaid Other | Admitting: Family Medicine
